# Patient Record
Sex: Male | Born: 1970 | Race: Black or African American | Hispanic: No | Marital: Single | Smoking: Current every day smoker
Health system: Southern US, Community
[De-identification: ages and names within clinical notes are randomized; demographics above are authoritative.]

## PROBLEM LIST (undated history)

## (undated) HISTORY — PX: OTHER SURGICAL HISTORY: SHX169

---

## 2019-04-02 DIAGNOSIS — J439 Emphysema, unspecified: Secondary | ICD-10-CM

## 2019-04-02 DIAGNOSIS — J939 Pneumothorax, unspecified: Secondary | ICD-10-CM

## 2019-04-02 HISTORY — DX: Emphysema, unspecified: J43.9

## 2019-04-02 HISTORY — DX: Pneumothorax, unspecified: J93.9

## 2019-04-20 ENCOUNTER — Other Ambulatory Visit: Payer: Self-pay

## 2019-04-20 ENCOUNTER — Emergency Department: Payer: Self-pay

## 2019-04-20 ENCOUNTER — Inpatient Hospital Stay
Admission: EM | Admit: 2019-04-20 | Discharge: 2019-04-29 | DRG: 163 | Disposition: A | Discharge status: Home / Self Care | Payer: Self-pay | Attending: Internal Medicine | Admitting: Internal Medicine

## 2019-04-20 ENCOUNTER — Encounter: Payer: Self-pay | Admitting: Internal Medicine

## 2019-04-20 DIAGNOSIS — R000 Tachycardia, unspecified: Secondary | ICD-10-CM | POA: Diagnosis present

## 2019-04-20 DIAGNOSIS — Z79899 Other long term (current) drug therapy: Secondary | ICD-10-CM

## 2019-04-20 DIAGNOSIS — J189 Pneumonia, unspecified organism: Secondary | ICD-10-CM | POA: Diagnosis present

## 2019-04-20 DIAGNOSIS — Z09 Encounter for follow-up examination after completed treatment for conditions other than malignant neoplasm: Secondary | ICD-10-CM

## 2019-04-20 DIAGNOSIS — J930 Spontaneous tension pneumothorax: Principal | ICD-10-CM | POA: Diagnosis present

## 2019-04-20 DIAGNOSIS — J9382 Other air leak: Secondary | ICD-10-CM | POA: Diagnosis present

## 2019-04-20 DIAGNOSIS — J939 Pneumothorax, unspecified: Secondary | ICD-10-CM

## 2019-04-20 DIAGNOSIS — J439 Emphysema, unspecified: Secondary | ICD-10-CM | POA: Diagnosis present

## 2019-04-20 DIAGNOSIS — Z23 Encounter for immunization: Secondary | ICD-10-CM

## 2019-04-20 DIAGNOSIS — Z20828 Contact with and (suspected) exposure to other viral communicable diseases: Secondary | ICD-10-CM | POA: Diagnosis present

## 2019-04-20 DIAGNOSIS — F17210 Nicotine dependence, cigarettes, uncomplicated: Secondary | ICD-10-CM | POA: Diagnosis present

## 2019-04-20 LAB — CBC WITH DIFFERENTIAL/PLATELET
Abs Immature Granulocytes: 0.13 10*3/uL — ABNORMAL HIGH (ref 0.00–0.07)
Basophils Absolute: 0.1 10*3/uL (ref 0.0–0.1)
Basophils Relative: 0 %
Eosinophils Absolute: 0 10*3/uL (ref 0.0–0.5)
Eosinophils Relative: 0 %
HCT: 42.6 % (ref 39.0–52.0)
Hemoglobin: 14.6 g/dL (ref 13.0–17.0)
Immature Granulocytes: 1 %
Lymphocytes Relative: 8 %
Lymphs Abs: 1.7 10*3/uL (ref 0.7–4.0)
MCH: 31.4 pg (ref 26.0–34.0)
MCHC: 34.3 g/dL (ref 30.0–36.0)
MCV: 91.6 fL (ref 80.0–100.0)
Monocytes Absolute: 1.6 10*3/uL — ABNORMAL HIGH (ref 0.1–1.0)
Monocytes Relative: 8 %
Neutro Abs: 18.2 10*3/uL — ABNORMAL HIGH (ref 1.7–7.7)
Neutrophils Relative %: 83 %
Platelets: 341 10*3/uL (ref 150–400)
RBC: 4.65 MIL/uL (ref 4.22–5.81)
RDW: 13.8 % (ref 11.5–15.5)
WBC: 21.7 10*3/uL — ABNORMAL HIGH (ref 4.0–10.5)
nRBC: 0 % (ref 0.0–0.2)

## 2019-04-20 LAB — COMPREHENSIVE METABOLIC PANEL
ALT: 41 U/L (ref 0–44)
AST: 55 U/L — ABNORMAL HIGH (ref 15–41)
Albumin: 4.2 g/dL (ref 3.5–5.0)
Alkaline Phosphatase: 97 U/L (ref 38–126)
Anion gap: 12 (ref 5–15)
BUN: 12 mg/dL (ref 6–20)
CO2: 23 mmol/L (ref 22–32)
Calcium: 9.7 mg/dL (ref 8.9–10.3)
Chloride: 101 mmol/L (ref 98–111)
Creatinine, Ser: 0.88 mg/dL (ref 0.61–1.24)
GFR calc Af Amer: >60 mL/min (ref >60)
GFR calc non Af Amer: >60 mL/min (ref >60)
Glucose, Bld: 113 mg/dL — ABNORMAL HIGH (ref 70–99)
Potassium: 3.8 mmol/L (ref 3.5–5.1)
Sodium: 136 mmol/L (ref 135–145)
Total Bilirubin: 1 mg/dL (ref 0.3–1.2)
Total Protein: 8.9 g/dL — ABNORMAL HIGH (ref 6.5–8.1)

## 2019-04-20 LAB — SARS CORONAVIRUS 2 (TAT 6-24 HRS): SARS Coronavirus 2: NEGATIVE

## 2019-04-20 LAB — PROTIME-INR
INR: 1.1 (ref 0.8–1.2)
Prothrombin Time: 13.8 seconds (ref 11.4–15.2)

## 2019-04-20 LAB — LACTIC ACID, PLASMA: Lactic Acid, Venous: 1.7 mmol/L (ref 0.5–1.9)

## 2019-04-20 LAB — APTT: aPTT: 34 seconds (ref 24–36)

## 2019-04-20 LAB — HIV ANTIBODY (ROUTINE TESTING W REFLEX): HIV Screen 4th Generation wRfx: NONREACTIVE

## 2019-04-20 MED ORDER — MORPHINE SULFATE (PF) 4 MG/ML IV SOLN
4.0000 mg | Freq: Once | INTRAVENOUS | Status: AC
  Administered 2019-04-20: 4 mg via INTRAVENOUS
  Filled 2019-04-20: qty 1

## 2019-04-20 MED ORDER — MORPHINE SULFATE (PF) 4 MG/ML IV SOLN
4.0000 mg | INTRAVENOUS | Status: DC | PRN
  Administered 2019-04-20: 4 mg via INTRAVENOUS
  Filled 2019-04-20: qty 1

## 2019-04-20 MED ORDER — SODIUM CHLORIDE 0.9 % IV SOLN
2.0000 g | Freq: Three times a day (TID) | INTRAVENOUS | Status: AC
  Administered 2019-04-20 – 2019-04-24 (×13): 2 g via INTRAVENOUS
  Filled 2019-04-20 (×15): qty 2

## 2019-04-20 MED ORDER — ENOXAPARIN SODIUM 40 MG/0.4ML ~~LOC~~ SOLN
40.0000 mg | SUBCUTANEOUS | Status: DC
  Administered 2019-04-20 – 2019-04-21 (×2): 40 mg via SUBCUTANEOUS
  Filled 2019-04-20 (×2): qty 0.4

## 2019-04-20 MED ORDER — ONDANSETRON HCL 4 MG/2ML IJ SOLN: 4.0000 mg | Freq: Four times a day (QID) | INTRAMUSCULAR | Status: DC | PRN

## 2019-04-20 MED ORDER — VANCOMYCIN HCL IN DEXTROSE 1-5 GM/200ML-% IV SOLN
1000.0000 mg | Freq: Two times a day (BID) | INTRAVENOUS | Status: DC
  Administered 2019-04-21: 1000 mg via INTRAVENOUS
  Filled 2019-04-20 (×3): qty 200

## 2019-04-20 MED ORDER — VANCOMYCIN HCL 1.5 G IV SOLR
1500.0000 mg | Freq: Once | INTRAVENOUS | Status: AC
  Administered 2019-04-20: 1500 mg via INTRAVENOUS
  Filled 2019-04-20: qty 1500

## 2019-04-20 MED ORDER — SODIUM CHLORIDE 0.9 % IV SOLN
INTRAVENOUS | Status: DC
  Administered 2019-04-20: 16:00:00 via INTRAVENOUS

## 2019-04-20 MED ORDER — ONDANSETRON HCL 4 MG PO TABS: 4.0000 mg | ORAL_TABLET | Freq: Four times a day (QID) | ORAL | Status: DC | PRN

## 2019-04-20 MED ORDER — NICOTINE 21 MG/24HR TD PT24
21.0000 mg | MEDICATED_PATCH | Freq: Every day | TRANSDERMAL | Status: DC
  Administered 2019-04-20 – 2019-04-29 (×10): 21 mg via TRANSDERMAL
  Filled 2019-04-20 (×10): qty 1

## 2019-04-20 MED ORDER — ACETAMINOPHEN 650 MG RE SUPP: 650.0000 mg | Freq: Four times a day (QID) | RECTAL | Status: DC | PRN

## 2019-04-20 MED ORDER — SODIUM CHLORIDE 0.9 % IV SOLN
2.0000 g | Freq: Once | INTRAVENOUS | Status: AC
  Administered 2019-04-20: 2 g via INTRAVENOUS
  Filled 2019-04-20: qty 2

## 2019-04-20 MED ORDER — LIDOCAINE HCL (PF) 1 % IJ SOLN
INTRAMUSCULAR | Status: AC
  Administered 2019-04-20: 5 mL via INTRADERMAL
  Filled 2019-04-20: qty 5

## 2019-04-20 MED ORDER — LIDOCAINE HCL (PF) 1 % IJ SOLN
5.0000 mL | Freq: Once | INTRAMUSCULAR | Status: AC
  Administered 2019-04-20: 12:00:00 5 mL via INTRADERMAL

## 2019-04-20 MED ORDER — ACETAMINOPHEN 500 MG PO TABS
ORAL_TABLET | ORAL | Status: AC
  Administered 2019-04-20: 1000 mg
  Filled 2019-04-20: qty 2

## 2019-04-20 MED ORDER — SENNOSIDES-DOCUSATE SODIUM 8.6-50 MG PO TABS: 1.0000 | ORAL_TABLET | Freq: Every evening | ORAL | Status: DC | PRN

## 2019-04-20 MED ORDER — HYDROCODONE-ACETAMINOPHEN 5-325 MG PO TABS
1.0000 | ORAL_TABLET | ORAL | Status: DC | PRN
  Administered 2019-04-20 (×2): 1 via ORAL
  Filled 2019-04-20 (×2): qty 1

## 2019-04-20 MED ORDER — VANCOMYCIN HCL IN DEXTROSE 1-5 GM/200ML-% IV SOLN: 1000.0000 mg | Freq: Once | INTRAVENOUS | Status: DC

## 2019-04-20 MED ORDER — OXYCODONE HCL 5 MG PO TABS
5.0000 mg | ORAL_TABLET | Freq: Four times a day (QID) | ORAL | Status: DC | PRN
  Administered 2019-04-20 – 2019-04-21 (×2): 5 mg via ORAL
  Filled 2019-04-20 (×3): qty 1

## 2019-04-20 MED ORDER — ACETAMINOPHEN 325 MG PO TABS
650.0000 mg | ORAL_TABLET | Freq: Four times a day (QID) | ORAL | Status: DC | PRN
  Administered 2019-04-23: 650 mg via ORAL
  Filled 2019-04-20: qty 2

## 2019-04-20 MED ORDER — METRONIDAZOLE IN NACL 5-0.79 MG/ML-% IV SOLN
500.0000 mg | Freq: Once | INTRAVENOUS | Status: AC
  Administered 2019-04-20: 500 mg via INTRAVENOUS
  Filled 2019-04-20: qty 100

## 2019-04-20 NOTE — Consult Note (Signed)
Pharmacy Antibiotic Note  Jose Andrews is a 48 y.o. male admitted on 04/20/2019 with sepsis (source unknown).  Pharmacy has been consulted for Cefepime and Vancomycin dosing.  Plan: 1. Cefepime 2 g Q8H- will start @2100  today.  2. Vancomycin 1000 mg Q12H - @0200  tomorrow. AUC Goal 400-550 Expected AUC: 509.6 Scr Used: 0.88   Height: 6\' 2"  (188 cm) Weight: 145 lb (65.8 kg) IBW/kg (Calculated) : 82.2  Temp (24hrs), Avg:102 F (38.9 C), Min:102 F (38.9 C), Max:102 F (38.9 C)  Recent Labs  Lab 04/20/19 1110 04/20/19 1258  WBC 21.7*  --   CREATININE 0.88  --   LATICACIDVEN  --  1.7    Estimated Creatinine Clearance: 94.5 mL/min (by C-G formula based on SCr of 0.88 mg/dL).    No Known Allergies  Antimicrobials this admission: 10/19 Cefepime >> 10/19 Vancomycin >>  10/19 Metronidazole x1   Microbiology results: 10/19 BCx: Pending  10/19 UCx: Pending    Thank you for allowing pharmacy to be a part of this patient's care.  Rowland Lathe 04/20/2019 1:52 PM

## 2019-04-20 NOTE — Consult Note (Signed)
PHARMACY -  BRIEF ANTIBIOTIC NOTE   Pharmacy has received consult(s) for Cefepime/Vancomycin from an ED provider.  The patient's profile has been reviewed for ht/wt/allergies/indication/available labs.    One time order(s) placed for Cefepime 2g x1 & Vancomycin 1.5 gx 1 dose.   Further antibiotics/pharmacy consults should be ordered by admitting physician if indicated.                       Thank you, Rowland Lathe 04/20/2019  12:50 PM

## 2019-04-20 NOTE — ED Triage Notes (Signed)
Pt to ED via [facility] from urgent care for chief complaint of chest pain and SHOB. Pt states chest pain began on Friday and SHOB began last week. States he was tested for COVID today and tested negative.  Pt appears now febrile and labored breathing with guarding to abdomen and chest.  EDP at bedside.

## 2019-04-20 NOTE — ED Provider Notes (Signed)
[facility] Emergency Department Provider Note  Time seen: 12:15 PM  I have reviewed the triage vital signs and the nursing notes.   HISTORY  Chief Complaint Shortness of Breath and Chest Pain   HPI Jose Andrews is a 48 y.o. male with no significant past medical history presents to the emergency department for fever shortness of breath and cough.  According to the patient over the past 4 days he has been experiencing cough which has worsened over the past 24 hours along with shortness of breath which is worsened over the past 24 hours.  Upon arrival patient has an O2 saturation in the 80s on room air, low 90s on 2 L of oxygen.  No baseline O2 requirement.  Denies any history of asthma or COPD.  Patient is found to be febrile to 102, tachycardic and tachypneic.  Patient has mild respiratory distress due to difficulty breathing and frequent coughing.   No past medical history on file.  There are no active problems to display for this patient.    Prior to Admission medications   Not on File    Not on File  No family history on file.  Social History Social History   Tobacco Use  . Smoking status: Not on file  Substance Use Topics  . Alcohol use: Not on file  . Drug use: Not on file    Review of Systems Constitutional: Positive for fever Cardiovascular: Positive for chest pain, mild.  States only has pain when coughing described as sharp. Respiratory: Positive for shortness of breath.  Positive for cough. Gastrointestinal: Negative for abdominal pain Musculoskeletal: Negative for leg pain or swelling Skin: Negative for skin complaints  Neurological: Negative for headache All other ROS negative  ____________________________________________   PHYSICAL EXAM:  VITAL SIGNS: ED Triage Vitals  Enc Vitals Group     BP 04/20/19 1108 (!) 146/106     Pulse Rate 04/20/19 1107 (!) 136     Resp 04/20/19 1107 (!) 46     Temp 04/20/19 1107 (!) 102 F  (38.9 C)     Temp Source 04/20/19 1107 Oral     SpO2 04/20/19 1106 92 %     Weight 04/20/19 1108 145 lb (65.8 kg)     Height 04/20/19 1108 6\' 2"  (1.88 m)     Head Circumference --      Peak Flow --      Pain Score 04/20/19 1108 8     Pain Loc --      Pain Edu? --      Excl. in [place]? --    Constitutional: Alert and oriented.  Mild distress due to difficulty breathing.  Frequent cough. Eyes: Normal exam ENT      Head: Normocephalic and atraumatic.      Mouth/Throat: Mucous membranes are moist. Cardiovascular: Regular rhythm rate around 120 bpm. Respiratory: Patient is quite tachypneic, appears to have clear breath sounds bilaterally.  No obvious wheeze rales or rhonchi.  Frequent cough during exam. Gastrointestinal: Soft and nontender. No distention.  Musculoskeletal: Nontender with normal range of motion in all extremities.  Neurologic:  Normal speech and language. No gross focal neurologic deficits are appreciated. Skin:  Skin is warm, dry and intact.  Psychiatric: Mood and affect are normal. Speech and behavior are normal.   ____________________________________________    EKG  EKG viewed and interpreted by myself shows sinus tachycardia 135 bpm with a narrow QRS, right axis deviation, nonspecific ST changes.  ____________________________________________  RADIOLOGY  X-ray consistent with tension pneumothorax of the right lung.  ____________________________________________   INITIAL IMPRESSION / ASSESSMENT AND PLAN / ED COURSE  Pertinent labs & imaging results that were available during my care of the patient were reviewed by me and considered in my medical decision making (see chart for details).   Patient presents to the emergency department for shortness of breath and cough.  Portable chest x-ray consistent with tension pneumothorax.  Vital signs show hypoxia tachypnea and tachycardia.  Patient is febrile to 102.  I placed a chest tube into the patient's right  hemithorax, with immediate coughing air rush heard.  Repeat chest x-ray confirms lung reexpansion.  However the patient was febrile to 102 tachycardic and tachypneic.  Differential would still include pneumonia, Covid.  Difficult to say whether the patient had an infection leading to cough and pneumothorax versus pneumothorax leading to infection and fever.  If Covid is negative we will cover with broad-spectrum antibiotics.  Regardless patient will require admission to the hospital.  Hospital unable to run a rapid Covid due to significant shortage.  We will cover with broad-spectrum antibiotics given the patient meets sepsis criteria, Covid pending.  Patient will be admitted to the medical service with surgery consulting for chest tube management.  Jose Andrews was evaluated in Emergency Department on 04/20/2019 for the symptoms described in the history of present illness. He was evaluated in the context of the global COVID-19 pandemic, which necessitated consideration that the patient might be at risk for infection with the SARS-CoV-2 virus that causes COVID-19. Institutional protocols and algorithms that pertain to the evaluation of patients at risk for COVID-19 are in a state of rapid change based on information released by regulatory bodies including the CDC and federal and state organizations. These policies and algorithms were followed during the patient's care in the ED.  CRITICAL CARE Performed by: Minna AntisKevin Ashish Rossetti   Total critical care time: 30 minutes  Critical care time was exclusive of separately billable procedures and treating other patients.  Critical care was necessary to treat or prevent imminent or life-threatening deterioration.  Critical care was time spent personally by me on the following activities: development of treatment plan with patient and/or surrogate as well as nursing, discussions with consultants, evaluation of patient's response to treatment, examination of  patient, obtaining history from patient or surrogate, ordering and performing treatments and interventions, ordering and review of laboratory studies, ordering and review of radiographic studies, pulse oximetry and re-evaluation of patient's condition.  CHEST TUBE INSERTION Date/Time: 04/20/2019 at 12:51 PM Performed by: Minna AntisKevin Elli Groesbeck Consent: The procedure was performed in an emergent situation. Imaging studies: imaging studies available Required items: required blood products, implants, devices, and special equipment available Patient identity confirmed: arm band and available demographic data Time out: Immediately prior to procedure a "time out" was called to verify the correct patient, procedure, equipment, support staff and site/side marked as required.  Indications: tension pneumothorax",pneumothorax  Patient sedated: no Anesthesia: yes local anesthetic. Preparation: skin prepped with ChloraPrep  Placement location: Right anterior lateral   Scalpel size: 11 Tube size: 8 French  Tension pneumothorax heard: yes Tube connected to: water seal Drainage characteristics: Minimal blood Drainage amount: 1-2 ml  Suture material: -0 silk Dressing: Vaseline gauze, 4 x 4's, tape  Post-insertion x-ray findings: Reinflation of the lung. Patient tolerance: Patient tolerated the procedure well with no immediate complications       ____________________________________________   FINAL CLINICAL IMPRESSION(S) / ED DIAGNOSES  Tension  pneumothorax   Minna Antis, MD 04/20/19 1253

## 2019-04-20 NOTE — ED Notes (Signed)
Surgical team at bedside. Told pt he will not be having surgical intervention today. Surgical team reportedly told pt he could have food and drink.

## 2019-04-20 NOTE — Consult Note (Signed)
[facility] SUR[facility] CON[facility]ULTATION NOTE (initial) - cpt: 95284: 99243 (Outpatient/ED)   HISTORY OF PRESENT ILLNESS (HPI):  48 y.o. male [facility] today for evaluation of SOB. Patient reports that over the last 4 days he has had worsening SOB and cough. This became severe in the last 24 hours. He also reports a subjective fever with these symptoms and he was febrile to 102F on arrival. Nothing seems to make these symptoms better. SOB became constant. He denied any chest pain, nausea, or emesis with these symptoms. No history of trauma, COPD, or asthma. He is an active smoker. While in the ED, he was found to be hypoxic and CXR showed showed right tension pneumothorax. He was also found to have significant leukocytosis to 21K.   Surgery is consulted by emergency medicine physician Dr. Lenard LancePaduchowski, MD in this context for evaluation and management of right tension pneumothorax.   PAST MEDICAL HISTORY (PMH):  No past medical history on file.   PAST SURGICAL HISTORY (PSH):  No previous surgeries   MEDICATIONS:  Prior to Admission medications   Medication Sig Start Date End Date Taking? Authorizing Provider  acetaminophen (TYLENOL) 500 MG tablet Take 500-1,000 mg by mouth every 6 (six) hours as needed for mild pain, moderate pain or fever.   Yes [provider]     ALLERGIES:  No Known Allergies   SOCIAL HISTORY:  Social History   Socioeconomic History  . Marital status: Single    Spouse name: Not on file  . Number of children: Not on file  . Years of education: Not on file  . Highest education level: Not on file  Occupational History  . Not on file  Social Needs  . Financial resource strain: Not on file  . Food insecurity    Worry: Not on file    Inability: Not on file  . Transportation needs    Medical: Not on file    Non-medical: Not on file  Tobacco Use  . Smoking status: Current Every Day Smoker  . Smokeless tobacco: Never Used  Substance and  Sexual Activity  . Alcohol use: Never    Frequency: Never  . Drug use: Never  . Sexual activity: Yes  Lifestyle  . Physical activity    Days per week: Not on file    Minutes per session: Not on file  . Stress: Not on file  Relationships  . Social Musicianconnections    Talks on phone: Not on file    Gets together: Not on file    Attends religious service: Not on file    Active member of club or organization: Not on file    Attends meetings of clubs or organizations: Not on file    Relationship status: Not on file  . Intimate partner violence    Fear of current or ex partner: Not on file    Emotionally abused: Not on file    Physically abused: Not on file    Forced sexual activity: Not on file  Other Topics Concern  . Not on file  Social History Narrative  . Not on file     FAMILY HISTORY:  No family history on file.    REVIEW OF SYSTEMS:  Review of Systems  Constitutional: Positive for fever. Negative for chills.  HENT: Negative for congestion and sore throat.   Respiratory: Positive for cough and shortness of breath. Negative for hemoptysis, sputum production and wheezing.   Cardiovascular: Negative for chest pain and palpitations.  Gastrointestinal: Negative for abdominal pain, nausea and vomiting.  Neurological: Negative for dizziness and headaches.  All other systems reviewed and are negative.   VITAL SIGNS:  Temp:  [102 F (38.9 C)] 102 F (38.9 C) (10/19 1107) Pulse Rate:  [117-136] 117 (10/19 1300) Resp:  [30-46] 35 (10/19 1300) BP: (137-150)/(94-106) 137/94 (10/19 1300) SpO2:  [91 %-100 %] 100 % (10/19 1300) Weight:  [65.8 kg] 65.8 kg (10/19 1108)     Height: 6\' 2"  (188 cm) Weight: 65.8 kg BMI (Calculated): 18.61   INTAKE/OUTPUT:  No intake/output data recorded.  PHYSICAL EXAM:  Physical Exam Vitals signs and nursing note reviewed.  Constitutional:      General: He is not in acute distress.    Appearance: He is well-developed and normal weight.  HENT:      Head: Normocephalic and atraumatic.     Mouth/Throat:     Mouth: Mucous membranes are moist.     Pharynx: Oropharynx is clear.  Eyes:     Extraocular Movements: Extraocular movements intact.     Pupils: Pupils are equal, round, and reactive to light.  Cardiovascular:     Rate and Rhythm: Regular rhythm. Tachycardia present.     Heart sounds: No murmur. No friction rub. No gallop.   Pulmonary:     Effort: Pulmonary effort is normal. No tachypnea, accessory muscle usage or respiratory distress.     Breath sounds: No decreased breath sounds, wheezing or rhonchi.  Chest:     Chest wall: No deformity or crepitus.     Comments: Chest tube to right lateral chest wall, air leak Abdominal:     Palpations: Abdomen is soft. There is no mass.     Tenderness: There is no abdominal tenderness.  Musculoskeletal: Normal range of motion.     Right lower leg: No edema.     Left lower leg: No edema.  Skin:    General: Skin is warm and dry.     Coloration: Skin is not pale.     Findings: No erythema.  Neurological:     General: No focal deficit present.     Mental Status: He is alert and oriented to person, place, and time.  Psychiatric:        Mood and Affect: Mood normal.        Behavior: Behavior normal.      Labs:  CBC Latest Ref Rng & Units 04/20/2019  WBC 4.0 - 10.5 K/uL 21.7(H)  Hemoglobin 13.0 - 17.0 g/dL 04/22/2019  Hematocrit 17.5 - 52.0 % 42.6  Platelets 150 - 400 K/uL 341   CMP Latest Ref Rng & Units 04/20/2019  Glucose 70 - 99 mg/dL 04/22/2019)  BUN 6 - 20 mg/dL 12  Creatinine 585(I - 7.78 mg/dL 2.42  Sodium 3.53 - 614 mmol/L 136  Potassium 3.5 - 5.1 mmol/L 3.8  Chloride 98 - 111 mmol/L 101  CO2 22 - 32 mmol/L 23  Calcium 8.9 - 10.3 mg/dL 9.7  Total Protein 6.5 - 8.1 g/dL 8.9(H)  Total Bilirubin 0.3 - 1.2 mg/dL 1.0  Alkaline Phos 38 - 126 U/L 97  AST 15 - 41 U/L 55(H)  ALT 0 - 44 U/L 41     Imaging studies:   CXR (04/20/2019 - @1120 ) personally reviewed which showed  significant right pneumothorax with tension component, and radiologist report reviewed:  IMPRESSION: Changes consistent with tension pneumothorax on the right with mediastinal shift to the left.   CXR (04/20/2019 - @1210 ) personally reviewed which showed placement  of right chest tube with re-inflation of right lung, and radiologist report reviewed:  IMPRESSION: Interval re-expansion of the right lung with small bore chest tube in place. No visible pneumothorax.  Improving patchy left lung airspace disease.   Assessment/Plan: (ICD-10's: J93.0) 48 y.o. male with SOB attributable to right pneumothorax with tension component s/p placement of chest tube in ED, complicated by fever without obvious source   - Admit to medicine service for fever/infection work up   - Appreciate ED help with placement of chest tube; -20 cm suction; monitor output/air leak   - Morning CXR   - Pulmonary toilet; IS use  - Further management per medicine service  All of the above findings and recommendations were discussed with the patient, and all of patient's questions were answered to his expressed satisfaction.  Thank you for the opportunity to participate in this patient's care.   -- Edison Simon, PA-C [facility] 04/20/2019, 2:13 PM [phone] M-F: 7am - 4pm

## 2019-04-20 NOTE — H&P (Signed)
[facility] - [place] at [facility]   PATIENT NAME: Jose Andrews    MR#:  [id number]  DATE OF BIRTH:  1970/10/13  DATE OF ADMISSION:  04/20/2019  PRIMARY CARE PHYSICIAN: Patient, No Pcp Per   REQUESTING/REFERRING PHYSICIAN:   CHIEF COMPLAINT:   Chief Complaint  Patient presents with  . Shortness of Breath  . Chest Pain    HISTORY OF PRESENT ILLNESS: Jose Andrews  is a 48 y.o. male with no significant past medical history presented to emergency room for cough for the last couple of days.  Patient also had fever.  Patient had excessive cough and had chest discomfort he came to the emergency room with short of breath he was evaluated chest x-ray showed pneumothorax.  Patient has been longtime smoker and chest tube was placed by ER attending.  Surgery was consulted.  Patient has elevated WBC count and started on IV antibiotics.  No recent travel, sick contacts.  Has low-grade fever, chills and myalgias.  COVID-19 test pending.  PAST MEDICAL HISTORY: No history of COPD diabetes and heart disease  PAST SURGICAL HISTORY: None  SOCIAL HISTORY:  Social History   Tobacco Use  . Smoking status: Current Every Day Smoker  . Smokeless tobacco: Never Used  Substance Use Topics  . Alcohol use: Never    Frequency: Never    FAMILY HISTORY: No history of COPD diabetes cancer or lung disease in the family  DRUG ALLERGIES: No Known Allergies  REVIEW OF SYSTEMS:   CONSTITUTIONAL: Has fever, fatigue and weakness.  EYES: No blurred or double vision.  EARS, NOSE, AND THROAT: No tinnitus or ear pain.  RESPIRATORY: Has cough, shortness of breath,  No wheezing or hemoptysis.  CARDIOVASCULAR: No chest pain, orthopnea, edema.  GASTROINTESTINAL: No nausea, vomiting, diarrhea or abdominal pain.  GENITOURINARY: No dysuria, hematuria.  ENDOCRINE: No polyuria, nocturia,  HEMATOLOGY: No anemia, easy bruising or bleeding SKIN: No rash or lesion. MUSCULOSKELETAL: No joint  pain or arthritis.   NEUROLOGIC: No tingling, numbness, weakness.  PSYCHIATRY: No anxiety or depression.   MEDICATIONS AT HOME:  Prior to Admission medications   Medication Sig Start Date End Date Taking? Authorizing Provider  acetaminophen (TYLENOL) 500 MG tablet Take 500-1,000 mg by mouth every 6 (six) hours as needed for mild pain, moderate pain or fever.   Yes [provider]      PHYSICAL EXAMINATION:   VITAL SIGNS: Blood pressure (!) 137/94, pulse (!) 117, temperature (!) 102 F (38.9 C), temperature source Oral, resp. rate (!) 35, height 6\' 2"  (1.88 m), weight 65.8 kg, SpO2 100 %.  GENERAL:  48 y.o.-year-old patient lying in the bed with no acute distress.  EYES: Pupils equal, round, reactive to light and accommodation. No scleral icterus. Extraocular muscles intact.  HEENT: Head atraumatic, normocephalic. Oropharynx and nasopharynx clear.  NECK:  Supple, no jugular venous distention. No thyroid enlargement, no tenderness.  LUNGS: Decreased breath sounds bilaterally, improved airflow right lung. No use of accessory muscles of respiration.  Chest tube noted on the right side CARDIOVASCULAR: S1, S2 normal. No murmurs, rubs, or gallops.  ABDOMEN: Soft, nontender, nondistended. Bowel sounds present. No organomegaly or mass.  EXTREMITIES: No pedal edema, cyanosis, or clubbing.  NEUROLOGIC: Cranial nerves II through XII are intact. Muscle strength 5/5 in all extremities. Sensation intact. Gait not checked.  PSYCHIATRIC: The patient is alert and oriented x 3.  SKIN: No obvious rash, lesion, or ulcer.   LABORATORY PANEL:   CBC Recent Labs  Lab 04/20/19 1110  WBC 21.7*  HGB 14.6  HCT 42.6  PLT 341  MCV 91.6  MCH 31.4  MCHC 34.3  RDW 13.8  LYMPHSABS 1.7  MONOABS 1.6*  EOSABS 0.0  BASOSABS 0.1   ------------------------------------------------------------------------------------------------------------------  Chemistries  Recent Labs  Lab 04/20/19 1110  NA  136  K 3.8  CL 101  CO2 23  GLUCOSE 113*  BUN 12  CREATININE 0.88  CALCIUM 9.7  AST 55*  ALT 41  ALKPHOS 97  BILITOT 1.0   ------------------------------------------------------------------------------------------------------------------ estimated creatinine clearance is 94.5 mL/min (by C-G formula based on SCr of 0.88 mg/dL). ------------------------------------------------------------------------------------------------------------------ No results for input(s): TSH, T4TOTAL, T3FREE, THYROIDAB in the last 72 hours.  Invalid input(s): FREET3   Coagulation profile Recent Labs  Lab 04/20/19 1110  INR 1.1   ------------------------------------------------------------------------------------------------------------------- No results for input(s): DDIMER in the last 72 hours. -------------------------------------------------------------------------------------------------------------------  Cardiac Enzymes No results for input(s): CKMB, TROPONINI, MYOGLOBIN in the last 168 hours.  Invalid input(s): CK ------------------------------------------------------------------------------------------------------------------ Invalid input(s): POCBNP  ---------------------------------------------------------------------------------------------------------------  Urinalysis No results found for: COLORURINE, APPEARANCEUR, LABSPEC, PHURINE, GLUCOSEU, HGBUR, BILIRUBINUR, KETONESUR, PROTEINUR, UROBILINOGEN, NITRITE, LEUKOCYTESUR   RADIOLOGY: Dg Chest Portable 1 View  Result Date: 04/20/2019 CLINICAL DATA:  Pneumothorax, chest tube EXAM: PORTABLE CHEST 1 VIEW COMPARISON:  04/20/2019 FINDINGS: Small bore right chest tube has been placed with re-expansion of the right lung. No visible pneumothorax currently. Heart is normal size. Patchy airspace disease in the left lung, improved since prior study. No effusions. IMPRESSION: Interval re-expansion of the right lung with small bore chest tube in  place. No visible pneumothorax. Improving patchy left lung airspace disease. Electronically Signed   By: Charlett NoseKevin  Dover M.D.   On: 04/20/2019 12:13   Dg Chest Portable 1 View  Result Date: 04/20/2019 CLINICAL DATA:  Chest pain and shortness of breath EXAM: PORTABLE CHEST 1 VIEW COMPARISON:  None. FINDINGS: Cardiac shadows within normal limits but mildly shift to the left as is the tracheal shadow. Left lung is well aerated without focal infiltrate. There is complete collapse of the right lung consistent with pneumothorax. Mild mediastinal shift to the left is noted consistent with a tension component. No bony abnormality seen. IMPRESSION: Changes consistent with tension pneumothorax on the right with mediastinal shift to the left. Critical Value/emergent results were called by telephone at the time of interpretation on 04/20/2019 at 11:28 am to Dr. Minna AntisKEVIN PADUCHOWSKI , who verbally acknowledged these results. Electronically Signed   By: Alcide CleverMark  Lukens M.D.   On: 04/20/2019 11:28    EKG: Orders placed or performed during the hospital encounter of 04/20/19  . EKG 12-Lead  . EKG 12-Lead  . ED EKG 12-Lead  . ED EKG 12-Lead    IMPRESSION AND PLAN: 48 year old male patient with no significant past medical history presented to emergency room for cough for the last couple of days.  Patient also had fever.  Patient had excessive cough and had chest discomfort he came to the emergency room with short of breath he was evaluated chest x-ray showed pneumothorax.  -Tension pneumothorax Status post chest tube Surgery consult for chest tube management Serial chest x-rays  -Systemic inflammatory response syndrome Start broad-spectrum IV antibiotics Follow-up cultures Follow-up WBC count  -Tobacco abuse Tobacco cessation counseled the patient for 6 minutes Nicotine patch offered  -DVT prophylaxis subcu Lovenox daily All the records are reviewed and case discussed with ED provider. Management plans  discussed with the patient, family and they are in agreement.  CODE STATUS: Full code  TOTAL TIME TAKING CARE OF THIS PATIENT: 53 minutes.    Saundra Shelling M.D on 04/20/2019 at 1:19 PM  Between 7am to 6pm - Pager - [phone]  After 6pm go to [email or web address] - password [email or web address] [facility]  Office  [phone]  CC: Primary care physician; Patient, No Pcp Per

## 2019-04-20 NOTE — Progress Notes (Signed)
CODE SEPSIS - PHARMACY COMMUNICATION  **Broad Spectrum Antibiotics should be administered within 1 hour of Sepsis diagnosis**  Time Code Sepsis Called/Page Received: @1243   Antibiotics Ordered: Cefepime/Vancomycin    Time of 1st antibiotic administration: Cefepime @1313   Additional action taken by pharmacy: N/A  If necessary, Name of Provider/Nurse Contacted: N/A    Rowland Lathe ,PharmD Clinical Pharmacist  04/20/2019  12:51 PM

## 2019-04-20 NOTE — Progress Notes (Signed)
Advanced care plan.  Purpose of the Encounter: CODE STATUS  Parties in Attendance: Patient  Patient's Decision Capacity: Good  Subjective/Patient's story: 48 y.o. male with no significant past medical history presented to emergency room for cough for the last couple of days.  Patient also had fever.  Patient had excessive cough and had chest discomfort he came to the emergency room with short of breath he was evaluated chest x-ray showed pneumothorax.  Patient has been longtime smoker and chest tube was placed by ER attending.  Surgery was consulted.  Patient has elevated WBC count and started on IV antibiotics.  No recent travel, sick contacts.  Has low-grade fever, chills and myalgias.  COVID-19 test pending   Objective/Medical story Patient needs chest tube for pneumothorax Needs surgical service to evaluate and follow-up on chest tube COVID-19 testing Treatment with antibiotics for sepsis   Goals of care determination:  Advance care directives goals of care treatment plan discussed Patient wants everything done which include CPR, intubation ventilator if the need arises   CODE STATUS: Full code   Time spent discussing advanced care planning: 16 minutes

## 2019-04-21 ENCOUNTER — Inpatient Hospital Stay: Payer: Self-pay

## 2019-04-21 DIAGNOSIS — J939 Pneumothorax, unspecified: Secondary | ICD-10-CM

## 2019-04-21 LAB — URINALYSIS, ROUTINE W REFLEX MICROSCOPIC
Bilirubin Urine: NEGATIVE
Glucose, UA: NEGATIVE mg/dL
Hgb urine dipstick: NEGATIVE
Ketones, ur: NEGATIVE mg/dL
Leukocytes,Ua: NEGATIVE
Nitrite: NEGATIVE
Protein, ur: 30 mg/dL — AB
Specific Gravity, Urine: 1.024 (ref 1.005–1.030)
pH: 6 (ref 5.0–8.0)

## 2019-04-21 LAB — CBC
HCT: 39.5 % (ref 39.0–52.0)
Hemoglobin: 13.5 g/dL (ref 13.0–17.0)
MCH: 31.3 pg (ref 26.0–34.0)
MCHC: 34.2 g/dL (ref 30.0–36.0)
MCV: 91.6 fL (ref 80.0–100.0)
Platelets: 314 10*3/uL (ref 150–400)
RBC: 4.31 MIL/uL (ref 4.22–5.81)
RDW: 13.9 % (ref 11.5–15.5)
WBC: 14.7 10*3/uL — ABNORMAL HIGH (ref 4.0–10.5)
nRBC: 0 % (ref 0.0–0.2)

## 2019-04-21 LAB — BASIC METABOLIC PANEL
Anion gap: 6 (ref 5–15)
BUN: 14 mg/dL (ref 6–20)
CO2: 25 mmol/L (ref 22–32)
Calcium: 8.6 mg/dL — ABNORMAL LOW (ref 8.9–10.3)
Chloride: 103 mmol/L (ref 98–111)
Creatinine, Ser: 0.97 mg/dL (ref 0.61–1.24)
GFR calc Af Amer: >60 mL/min (ref >60)
GFR calc non Af Amer: >60 mL/min (ref >60)
Glucose, Bld: 102 mg/dL — ABNORMAL HIGH (ref 70–99)
Potassium: 4.7 mmol/L (ref 3.5–5.1)
Sodium: 134 mmol/L — ABNORMAL LOW (ref 135–145)

## 2019-04-21 LAB — MRSA PCR SCREENING: MRSA by PCR: NEGATIVE

## 2019-04-21 MED ORDER — OXYCODONE HCL 5 MG PO TABS
10.0000 mg | ORAL_TABLET | ORAL | Status: DC | PRN
  Administered 2019-04-21 – 2019-04-24 (×10): 10 mg via ORAL
  Filled 2019-04-21 (×9): qty 2

## 2019-04-21 MED ORDER — MORPHINE SULFATE (PF) 4 MG/ML IV SOLN
3.0000 mg | Freq: Once | INTRAVENOUS | Status: AC
  Administered 2019-04-21: 3 mg via INTRAVENOUS
  Filled 2019-04-21: qty 1

## 2019-04-21 MED ORDER — OXYCODONE HCL 5 MG PO TABS
5.0000 mg | ORAL_TABLET | ORAL | Status: DC | PRN
  Administered 2019-04-21: 5 mg via ORAL

## 2019-04-21 MED ORDER — BENZONATATE 100 MG PO CAPS
100.0000 mg | ORAL_CAPSULE | Freq: Three times a day (TID) | ORAL | Status: DC
  Administered 2019-04-21 – 2019-04-29 (×23): 100 mg via ORAL
  Filled 2019-04-21 (×23): qty 1

## 2019-04-21 MED ORDER — MORPHINE SULFATE (PF) 2 MG/ML IV SOLN: 2.0000 mg | INTRAVENOUS | Status: DC | PRN

## 2019-04-21 MED ORDER — MORPHINE SULFATE (PF) 2 MG/ML IV SOLN
2.0000 mg | INTRAVENOUS | Status: DC | PRN
  Administered 2019-04-21 – 2019-04-22 (×3): 2 mg via INTRAVENOUS
  Filled 2019-04-21 (×4): qty 1

## 2019-04-21 MED ORDER — SODIUM CHLORIDE 0.9% FLUSH
3.0000 mL | Freq: Two times a day (BID) | INTRAVENOUS | Status: DC
  Administered 2019-04-21 – 2019-04-28 (×16): 3 mL via INTRAVENOUS

## 2019-04-21 MED ORDER — VANCOMYCIN HCL 10 G IV SOLR
1750.0000 mg | INTRAVENOUS | Status: DC
  Administered 2019-04-21: 1750 mg via INTRAVENOUS
  Filled 2019-04-21 (×2): qty 1750

## 2019-04-21 NOTE — Progress Notes (Signed)
  Patient ID: Jose Andrews, male   DOB: Oct 03, 1970, 48 y.o.   MRN: [id number]  HISTORY: Overall he had a pretty quiet night.  He states he feels a lot better since he has been admitted to the hospital.  His temperature curve is been trending down as has his heart rate.  He did go downstairs today for chest x-ray.  That chest x-ray was independently reviewed by me and shows a progression of his right pneumothorax.   Vitals:   04/21/19 0604 04/21/19 0747  BP: 127/83 131/88  Pulse: 67 65  Resp: 20 20  Temp: 98.1 F (36.7 C) 97.8 F (36.6 C)  SpO2: 100% 100%     EXAM:  When I examined the patient today he was off suction.  I placed the chest tube back to suction and there is a large amount of air that came from the pleural space.  I then placed the tube back on suction at 20 cm of water and the air leak reverted back to its prior level which was a small air leak.  Resp: Lungs are clear bilaterally.  No respiratory distress, normal effort. Heart:  Regular without murmurs Abd:  Abdomen is soft, non distended and non tender. No masses are palpable.  There is no rebound and no guarding.  Neurological: Alert and oriented to person, place, and time. Coordination normal.  Skin: Skin is warm and dry. No rash noted. No diaphoretic. No erythema. No pallor.  Psychiatric: Normal mood and affect. Normal behavior. Judgment and thought content normal.    ASSESSMENT: Right spontaneous pneumothorax.  I placed this chest tube back to 20 cm water suction.  I repeated a stat portable chest x-ray and have independently reviewed that.  There is only a trace apical pneumothorax present and it is much improved to the 1 several hours ago.   PLAN:   We plan to leave the chest tube on suction for now.  He should have only portable x-rays for the time being.  At some point will need to consider obtaining a CT scan of the chest.    Nestor Lewandowsky, MDPatient ID: Jose Andrews, male   DOB: 1970-07-15, 48 y.o.    MRN: [id number]

## 2019-04-21 NOTE — Progress Notes (Signed)
[facility] [facility] at [place] NAME: Jose Andrews    MR#:  [id number]  DATE OF BIRTH:  [birth year]  SUBJECTIVE:   Came in with cough and chest pain..found to have tension PTX s/p CT placement Having lot of Chest pain on the right with cough REVIEW OF SYSTEMS:   Review of Systems  Constitutional: Negative for chills, fever and weight loss.  HENT: Negative for ear discharge, ear pain and nosebleeds.   Eyes: Negative for blurred vision, pain and discharge.  Respiratory: Positive for cough. Negative for sputum production, shortness of breath, wheezing and stridor.   Cardiovascular: Positive for chest pain. Negative for palpitations, orthopnea and PND.  Gastrointestinal: Negative for abdominal pain, diarrhea, nausea and vomiting.  Genitourinary: Negative for frequency and urgency.  Musculoskeletal: Negative for back pain and joint pain.  Neurological: Negative for sensory change, speech change, focal weakness and weakness.  Psychiatric/Behavioral: Negative for depression and hallucinations. The patient is not nervous/anxious.    Tolerating Diet:yes Tolerating PT: not needed  DRUG ALLERGIES:  No Known Allergies  VITALS:  Blood pressure 131/88, pulse 65, temperature 97.8 F (36.6 C), temperature source Oral, resp. rate 20, height 6\' 2"  (1.88 m), weight 63.4 kg, SpO2 100 %.  PHYSICAL EXAMINATION:   Physical Exam  GENERAL:  48 y.o.-year-old patient lying in the bed with no acute distress.  EYES: Pupils equal, round, reactive to light and accommodation. No scleral icterus. Extraocular muscles intact.  HEENT: Head atraumatic, normocephalic. Oropharynx and nasopharynx clear.  NECK:  Supple, no jugular venous distention. No thyroid enlargement, no tenderness.  LUNGS: Normal breath sounds bilaterally, no wheezing, rales, rhonchi. No use of accessory muscles of respiration.   Right side CT placed+ CARDIOVASCULAR: S1, S2 normal. No murmurs, rubs,  or gallops.  ABDOMEN: Soft, nontender, nondistended. Bowel sounds present. No organomegaly or mass.  EXTREMITIES: No cyanosis, clubbing or edema b/l.    NEUROLOGIC: Cranial nerves II through XII are intact. No focal Motor or sensory deficits b/l.   PSYCHIATRIC:  patient is alert and oriented x 3.  SKIN: No obvious rash, lesion, or ulcer.   LABORATORY PANEL:  CBC Recent Labs  Lab 04/21/19 0621  WBC 14.7*  HGB 13.5  HCT 39.5  PLT 314    Chemistries  Recent Labs  Lab 04/20/19 1110 04/21/19 0621  NA 136 134*  K 3.8 4.7  CL 101 103  CO2 23 25  GLUCOSE 113* 102*  BUN 12 14  CREATININE 0.88 0.97  CALCIUM 9.7 8.6*  AST 55*  --   ALT 41  --   ALKPHOS 97  --   BILITOT 1.0  --    Cardiac Enzymes No results for input(s): TROPONINI in the last 168 hours. RADIOLOGY:  Dg Chest 2 View  Result Date: 04/21/2019 CLINICAL DATA:  Sepsis, right-sided pneumothorax, chest tube EXAM: CHEST - 2 VIEW COMPARISON:  04/20/2019 FINDINGS: Substantial interval increase in size of a right-sided pneumothorax, now moderate, approximately 40% volume. Right-sided small bore chest tube remains in position with tip about the apex. Interval increase in dense interstitial opacity of the left lung. The heart and mediastinum are normal. IMPRESSION: 1. Substantial interval increase in size of a right-sided pneumothorax, now moderate, approximately 40% volume. Right-sided small bore chest tube remains in position with tip about the apex. 2. Interval increase in dense interstitial opacity of the left lung, consistent with worsened infection or edema. These results will be called to the ordering clinician or representative by  the Radiologist Assistant, and communication documented in the PACS or zVision Dashboard. Electronically Signed   By: Lauralyn PrimesAlex  Bibbey M.D.   On: 04/21/2019 09:06   Dg Chest Port 1 View  Result Date: 04/21/2019 CLINICAL DATA:  Follow-up right pneumothorax EXAM: PORTABLE CHEST 1 VIEW COMPARISON:   Film from earlier in the same day. FINDINGS: Cardiac shadow remains enlarged. The known right-sided pneumothorax is again nearly completely resolved with only a minimal apical component present. Increased interstitial changes are noted primarily within the left lung and the right lung base which may represent some re-expansion edema. No bony abnormality is seen. IMPRESSION: Near complete resolution of right-sided pneumothorax. Mild increased interstitial markings bilaterally worse on the left which may be related to some re-expansion edema. Electronically Signed   By: Alcide CleverMark  Lukens M.D.   On: 04/21/2019 11:23   Dg Chest Portable 1 View  Result Date: 04/20/2019 CLINICAL DATA:  Pneumothorax, chest tube EXAM: PORTABLE CHEST 1 VIEW COMPARISON:  04/20/2019 FINDINGS: Small bore right chest tube has been placed with re-expansion of the right lung. No visible pneumothorax currently. Heart is normal size. Patchy airspace disease in the left lung, improved since prior study. No effusions. IMPRESSION: Interval re-expansion of the right lung with small bore chest tube in place. No visible pneumothorax. Improving patchy left lung airspace disease. Electronically Signed   By: Charlett NoseKevin  Dover M.D.   On: 04/20/2019 12:13   Dg Chest Portable 1 View  Result Date: 04/20/2019 CLINICAL DATA:  Chest pain and shortness of breath EXAM: PORTABLE CHEST 1 VIEW COMPARISON:  None. FINDINGS: Cardiac shadows within normal limits but mildly shift to the left as is the tracheal shadow. Left lung is well aerated without focal infiltrate. There is complete collapse of the right lung consistent with pneumothorax. Mild mediastinal shift to the left is noted consistent with a tension component. No bony abnormality seen. IMPRESSION: Changes consistent with tension pneumothorax on the right with mediastinal shift to the left. Critical Value/emergent results were called by telephone at the time of interpretation on 04/20/2019 at 11:28 am to Dr. Minna AntisKEVIN  PADUCHOWSKI , who verbally acknowledged these results. Electronically Signed   By: Alcide CleverMark  Lukens M.D.   On: 04/20/2019 11:28   ASSESSMENT AND PLAN:  Jose Andrews  is a 48 y.o. male with no significant past medical history presented to emergency room for cough for the last couple of days.  Patient also had fever.  Patient had excessive cough and had chest discomfort he came to the emergency room with short of breath he was evaluated chest x-ray showed pneumothorax  * Right sided Tension pneumothorax -Status post chest tube -Surgery consult for chest tube management with Dr Thelma Bargeoaks -CXR from this am shows 40% volume loss--surgery adjusted suction  *Systemic inflammatory response syndrome -Start broad-spectrum IV antibiotics with cefepime -MRSA pCR pending--if negative d/c vanc -Follow-up cultures  * Emphysema on CXR -pt strongly advised on smoking cessation -prn inhalers  *Tobacco abuse Tobacco cessation counseled the patient for 6 minutes Nicotine patch offered  *DVT prophylaxis subcu Lovenox daily  Case discussed with Care Management/Social Worker. Management plans discussed with the patient and they are in agreement.  CODE STATUS: full  DVT Prophylaxis: lovenox  TOTAL TIME TAKING CARE OF THIS PATIENT: 30** minutes.  >50% time spent on counselling and coordination of care  POSSIBLE D/C IN *?* DAYS, DEPENDING ON CLINICAL CONDITION.  Note: This dictation was prepared with Dragon dictation along with smaller phrase technology. Any transcriptional errors that result from this  process are unintentional.  Enedina Finner M.D on 04/21/2019 at 1:23 PM  Between 7am to 6pm - Pager - [phone]  After 6pm go to [email or web address] - password [email or web address]  [facility]  Office  [phone]  CC: Primary care physician; Patient, No Pcp PerPatient ID: Jose Andrews, male   DOB: 01-Nov-1970, 48 y.o.   MRN: [id number]

## 2019-04-21 NOTE — Consult Note (Addendum)
Pharmacy Antibiotic Note  Jose Andrews is a 49 y.o. male admitted on 04/20/2019 with sepsis (source unknown).  Pharmacy has been consulted for Cefepime and Vancomycin dosing.  Plan: 1. Continue Cefepime 2 g Q8H  2. Update Vancomycin to 1750 mg Q24H AUC Goal 400-550 Expected AUC: 524.4 Scr Used: 0.97 (slight bump in Scr from 0.88 - adjusting dose)   Height: 6\' 2"  (188 cm) Weight: 139 lb 12.4 oz (63.4 kg) IBW/kg (Calculated) : 82.2  Temp (24hrs), Avg:99.4 F (37.4 C), Min:97.8 F (36.6 C), Max:102 F (38.9 C)  Recent Labs  Lab 04/20/19 1110 04/20/19 1258 04/21/19 0621  WBC 21.7*  --  14.7*  CREATININE 0.88  --  0.97  LATICACIDVEN  --  1.7  --     Estimated Creatinine Clearance: 82.6 mL/min (by C-G formula based on SCr of 0.97 mg/dL).    No Known Allergies  Antimicrobials this admission: 10/19 Cefepime >> 10/19 Vancomycin >>  (Dose adjusted from 1000mg  q12 to 1750mg  q24) 10/19 Metronidazole x1   Microbiology results: 10/19 BCx: Pending  10/19 UCx: Pending  COVID NEG  Thank you for allowing pharmacy to be a part of this patient's care.  Lu Duffel, PharmD, BCPS Clinical Pharmacist 04/21/2019 8:03 AM

## 2019-04-21 NOTE — Progress Notes (Signed)
Initial Nutrition Assessment  DOCUMENTATION CODES:   Underweight  INTERVENTION:   Ensure Enlive po TID, each supplement provides 350 kcal and 20 grams of protein  MVI daily   NUTRITION DIAGNOSIS:   Predicted suboptimal nutrient intake related to social / environmental circumstances as evidenced by other (comment)(pt is underweight).  GOAL:   Patient will meet greater than or equal to 90% of their needs  MONITOR:   PO intake, Supplement acceptance, Labs, Weight trends, Skin, I & O's  REASON FOR ASSESSMENT:   Other (Comment)(Low BMI)    ASSESSMENT:   48 y.o. male with SOB attributable to right pneumothorax with tension component s/p placement of chest tube in ED  RD working remotely.  Suspect pt with poor appetite and oral intake at baseline as pt is underweight. There is some mention of etoh abuse per chart review from 2018. Pt does appear weight stable pta. Pt is currently NPO. RD will add supplements and MVI once diet advanced.   Pt at high risk for malnutrition but unable to diagnose at this time as NFPE cannot be performed.     Medications reviewed and include: Lovenox, nicotine, cefepime, vancomycin   Labs reviewed: Na 134(L) Wbc- 14.7(H)  Unable to complete Nutrition-Focused physical exam at this time.   Diet Order:   Diet Order            Diet NPO time specified  Diet effective midnight             EDUCATION NEEDS:   No education needs have been identified at this time  Skin:  Skin Assessment: Reviewed RN Assessment  Last BM:  10/18  Height:   Ht Readings from Last 1 Encounters:  04/20/19 6\' 2"  (1.88 m)    Weight:   Wt Readings from Last 1 Encounters:  04/21/19 63.4 kg    Ideal Body Weight:  86.3 kg  BMI:  Body mass index is 17.95 kg/m.  Estimated Nutritional Needs:   Kcal:  2000-2300kcal/day  Protein:  100-115g/day  Fluid:  >1.9L/day  Koleen Distance MS, RD, LDN Pager #- [phone] Office#- [phone] After Hours  Pager: [phone]

## 2019-04-22 ENCOUNTER — Inpatient Hospital Stay: Payer: Self-pay

## 2019-04-22 LAB — BASIC METABOLIC PANEL
Anion gap: 8 (ref 5–15)
BUN: 9 mg/dL (ref 6–20)
CO2: 25 mmol/L (ref 22–32)
Calcium: 8.7 mg/dL — ABNORMAL LOW (ref 8.9–10.3)
Chloride: 104 mmol/L (ref 98–111)
Creatinine, Ser: 0.71 mg/dL (ref 0.61–1.24)
GFR calc Af Amer: >60 mL/min (ref >60)
GFR calc non Af Amer: >60 mL/min (ref >60)
Glucose, Bld: 102 mg/dL — ABNORMAL HIGH (ref 70–99)
Potassium: 3.8 mmol/L (ref 3.5–5.1)
Sodium: 137 mmol/L (ref 135–145)

## 2019-04-22 LAB — URINE CULTURE: Culture: NO GROWTH

## 2019-04-22 MED ORDER — PNEUMOCOCCAL 13-VAL CONJ VACC IM SUSP
0.5000 mL | INTRAMUSCULAR | Status: DC | PRN
  Filled 2019-04-22: qty 0.5

## 2019-04-22 MED ORDER — ADULT MULTIVITAMIN W/MINERALS CH
1.0000 | ORAL_TABLET | Freq: Every day | ORAL | Status: DC
  Administered 2019-04-22 – 2019-04-29 (×7): 1 via ORAL
  Filled 2019-04-22 (×7): qty 1

## 2019-04-22 MED ORDER — IOHEXOL 300 MG/ML  SOLN
75.0000 mL | Freq: Once | INTRAMUSCULAR | Status: AC | PRN
  Administered 2019-04-22: 75 mL via INTRAVENOUS

## 2019-04-22 MED ORDER — PNEUMOCOCCAL 13-VAL CONJ VACC IM SUSP
0.5000 mL | INTRAMUSCULAR | Status: AC | PRN
  Administered 2019-04-29: 0.5 mL via INTRAMUSCULAR
  Filled 2019-04-22 (×2): qty 0.5

## 2019-04-22 MED ORDER — SODIUM CHLORIDE 0.9 % IV SOLN
INTRAVENOUS | Status: DC | PRN
  Administered 2019-04-22 – 2019-04-23 (×2): 250 mL via INTRAVENOUS

## 2019-04-22 MED ORDER — ENSURE ENLIVE PO LIQD
237.0000 mL | Freq: Three times a day (TID) | ORAL | Status: DC
  Administered 2019-04-22 – 2019-04-29 (×18): 237 mL via ORAL

## 2019-04-22 NOTE — Progress Notes (Signed)
  Patient ID: Jose Andrews, male   DOB: Mar 03, 1971, 48 y.o.   MRN: [id number]  HISTORY: Overall he states that he feels better than when he was first admitted.  Today he did develop some shortness of breath just prior to his chest x-ray.  He also states that he no longer heard any bubbles.  After the x-ray was taken he coughed and noticed that there were more bubbles in the Pleur-evac.  I wonder if the tube might have been kinked or clogged to cause the chest x-ray from today which have independently reviewed to show a slightly larger pneumothorax on the right.  He does still have an air leak.  His lungs are otherwise clear.   Vitals:   04/22/19 0351 04/22/19 0755  BP: 115/81 115/78  Pulse: 72 74  Resp: 19 18  Temp: 98.6 F (37 C) 98.8 F (37.1 C)  SpO2: 100% 100%     EXAM:    Resp: Lungs are clear bilaterally.  No respiratory distress, normal effort. Heart:  Regular without murmurs Abd:  Abdomen is soft, non distended and non tender. No masses are palpable.  There is no rebound and no guarding.  Neurological: Alert and oriented to person, place, and time. Coordination normal.  Skin: Skin is warm and dry. No rash noted. No diaphoretic. No erythema. No pallor.  Psychiatric: Normal mood and affect. Normal behavior. Judgment and thought content normal.    ASSESSMENT: Right-sided spontaneous pneumothorax with persistent air leak   PLAN:   I would like to obtain a CT scan of the chest.  Because his lung has collapsed while on waterseal, we will leave his chest tube to suction during transport and during the procedure.  Once that is complete we can then develop a plan for therapy.  I did explain to him that this is likely to evolve either a thoracoscopic right thoracotomy approach for management of his air leak.  He understands.    Nestor Lewandowsky, MDPatient ID: Jose Andrews, male   DOB: 07-06-1970, 48 y.o.   MRN: [id number]

## 2019-04-22 NOTE — Progress Notes (Signed)
[facility] NAME: Jose Andrews    MR#:  [id number]  DATE OF BIRTH:  July 08, 1970  SUBJECTIVE:   Came in with cough and chest pain..found to have tension PTX s/p CT placement. Patient feels a bit better today. Able to eat. No shortness of breath. No fever  REVIEW OF SYSTEMS:   Review of Systems  Constitutional: Negative for chills, fever and weight loss.  HENT: Negative for ear discharge, ear pain and nosebleeds.   Eyes: Negative for blurred vision, pain and discharge.  Respiratory: Positive for cough. Negative for sputum production, shortness of breath, wheezing and stridor.   Cardiovascular: Positive for chest pain. Negative for palpitations, orthopnea and PND.  Gastrointestinal: Negative for abdominal pain, diarrhea, nausea and vomiting.  Genitourinary: Negative for frequency and urgency.  Musculoskeletal: Negative for back pain and joint pain.  Neurological: Negative for sensory change, speech change, focal weakness and weakness.  Psychiatric/Behavioral: Negative for depression and hallucinations. The patient is not nervous/anxious.    Tolerating Diet:yes Tolerating PT: not needed  DRUG ALLERGIES:  No Known Allergies  VITALS:  Blood pressure 115/78, pulse 74, temperature 98.8 F (37.1 C), temperature source Oral, resp. rate 18, height 6\' 2"  (1.88 m), weight 66.5 kg, SpO2 100 %.  PHYSICAL EXAMINATION:   Physical Exam  GENERAL:  48 y.o.-year-old patient lying in the bed with no acute distress.  EYES: Pupils equal, round, reactive to light and accommodation. No scleral icterus. Extraocular muscles intact.  HEENT: Head atraumatic, normocephalic. Oropharynx and nasopharynx clear.  NECK:  Supple, no jugular venous distention. No thyroid enlargement, no tenderness.  LUNGS: Normal breath sounds bilaterally, no wheezing, rales, rhonchi. No use of accessory muscles of respiration.   Right side CT + CARDIOVASCULAR: S1, S2  normal. No murmurs, rubs, or gallops.  ABDOMEN: Soft, nontender, nondistended. Bowel sounds present. No organomegaly or mass.  EXTREMITIES: No cyanosis, clubbing or edema b/l.    NEUROLOGIC: Cranial nerves II through XII are intact. No focal Motor or sensory deficits b/l.   PSYCHIATRIC:  patient is alert and oriented x 3.  SKIN: No obvious rash, lesion, or ulcer.   LABORATORY PANEL:  CBC Recent Labs  Lab 04/21/19 0621  WBC 14.7*  HGB 13.5  HCT 39.5  PLT 314    Chemistries  Recent Labs  Lab 04/20/19 1110  04/22/19 0506  NA 136   < > 137  K 3.8   < > 3.8  CL 101   < > 104  CO2 23   < > 25  GLUCOSE 113*   < > 102*  BUN 12   < > 9  CREATININE 0.88   < > 0.71  CALCIUM 9.7   < > 8.7*  AST 55*  --   --   ALT 41  --   --   ALKPHOS 97  --   --   BILITOT 1.0  --   --    < > = values in this interval not displayed.   Cardiac Enzymes No results for input(s): TROPONINI in the last 168 hours. RADIOLOGY:  Dg Chest 2 View  Result Date: 04/21/2019 CLINICAL DATA:  Sepsis, right-sided pneumothorax, chest tube EXAM: CHEST - 2 VIEW COMPARISON:  04/20/2019 FINDINGS: Substantial interval increase in size of a right-sided pneumothorax, now moderate, approximately 40% volume. Right-sided small bore chest tube remains in position with tip about the apex. Interval increase in dense interstitial opacity of the left lung. The heart  and mediastinum are normal. IMPRESSION: 1. Substantial interval increase in size of a right-sided pneumothorax, now moderate, approximately 40% volume. Right-sided small bore chest tube remains in position with tip about the apex. 2. Interval increase in dense interstitial opacity of the left lung, consistent with worsened infection or edema. These results will be called to the ordering clinician or representative by the Radiologist Assistant, and communication documented in the PACS or zVision Dashboard. Electronically Signed   By: Lauralyn PrimesAlex  Bibbey M.D.   On: 04/21/2019 09:06    Dg Chest Port 1 View  Result Date: 04/22/2019 CLINICAL DATA:  Pneumothorax on right. EXAM: PORTABLE CHEST 1 VIEW COMPARISON:  Radiograph yesterday. FINDINGS: Right pigtail catheter remains with tip directed towards the apex. Right pneumothorax is increased from exam yesterday, small to moderate sized visualized at the apex just above the right fourth rib. Probable right apical bleb. Curvilinear lucency projecting over the right mediastinum may be an additional bleb, this is unchanged from priors. Subcutaneous emphysema in the right chest wall. Slight progression in interstitial opacities throughout the left lung and in the right infrahilar region suggesting re-expansion pulmonary edema. IMPRESSION: 1. Increased size of right pneumothorax, small to moderate, pigtail catheter in place. 2. Increasing interstitial opacities throughout the left lung and right infrahilar region suggesting reexpansion pulmonary edema. 3. Right apical bleb. Possible additional bleb projecting over the right mediastinum. Electronically Signed   By: Narda RutherfordMelanie  Sanford M.D.   On: 04/22/2019 05:11   Dg Chest Port 1 View  Result Date: 04/21/2019 CLINICAL DATA:  Follow-up right pneumothorax EXAM: PORTABLE CHEST 1 VIEW COMPARISON:  Film from earlier in the same day. FINDINGS: Cardiac shadow remains enlarged. The known right-sided pneumothorax is again nearly completely resolved with only a minimal apical component present. Increased interstitial changes are noted primarily within the left lung and the right lung base which may represent some re-expansion edema. No bony abnormality is seen. IMPRESSION: Near complete resolution of right-sided pneumothorax. Mild increased interstitial markings bilaterally worse on the left which may be related to some re-expansion edema. Electronically Signed   By: Alcide CleverMark  Lukens M.D.   On: 04/21/2019 11:23   ASSESSMENT AND PLAN:  Jose Andrews  is a 48 y.o. male with no significant past medical history  presented to emergency room for cough for the last couple of days.  Patient also had fever.  Patient had excessive cough and had chest discomfort he came to the emergency room with short of breath he was evaluated chest x-ray showed pneumothorax  * Right sided Tension pneumothorax -Status post chest tube -Surgery consult for chest tube management with Dr Thelma Bargeoaks -CXR from this am shows 40% volume loss--surgery adjusted suction--some improvement in CXR -CT chest ordered today  *Systemic inflammatory response syndrome -Start broad-spectrum IV antibiotics with cefepime-- change to oral antibiotics in 1 to 2 days -MRSA pCR pending--if negative d/c vanc -Follow-up cultures negative so far  * Emphysema on CXR -pt strongly advised on smoking cessation -prn inhalers  *Tobacco abuse Tobacco cessation counseled the patient for 6 minutes Nicotine patch offered  *DVT prophylaxis subcu Lovenox daily  Case discussed with Care Management/Social Worker. Management plans discussed with the patient and they are in agreement.  CODE STATUS: full  DVT Prophylaxis: lovenox  TOTAL TIME TAKING CARE OF THIS PATIENT: 30** minutes.  >50% time spent on counselling and coordination of care  POSSIBLE D/C IN *?* DAYS, DEPENDING ON CLINICAL CONDITION.  Note: This dictation was prepared with Dragon dictation along with smaller phrase technology.  Any transcriptional errors that result from this process are unintentional.  Enedina Finner M.D on 04/22/2019 at 12:28 PM  Between 7am to 6pm - Pager - [phone]  After 6pm go to [email or web address] - password [email or web address]  [facility]  Office  [phone]  CC: Primary care physician; Patient, No Pcp PerPatient ID: Jose Andrews, male   DOB: January 29, 1971, 48 y.o.   MRN: [id number]

## 2019-04-22 NOTE — Progress Notes (Signed)
Pt c/o sharp 10/10 pain in the area of his chest where the chest tube dressing was. He stated that it was the same pain from earlier in the day. NO PRN meds for pain due, MD Jannifer Franklin made aware. One time dose IV morphine 3 mg ordered. Pt reports success with controlling pain after administration.

## 2019-04-23 ENCOUNTER — Inpatient Hospital Stay: Payer: Self-pay | Admitting: Anesthesiology

## 2019-04-23 ENCOUNTER — Inpatient Hospital Stay: Payer: Self-pay

## 2019-04-23 ENCOUNTER — Encounter: Payer: Self-pay | Admitting: Anesthesiology

## 2019-04-23 ENCOUNTER — Encounter
Admission: EM | Disposition: A | Discharge status: Home / Self Care | Payer: Self-pay | Source: Home / Self Care | Attending: Internal Medicine

## 2019-04-23 HISTORY — PX: THORACOTOMY: SHX5074

## 2019-04-23 HISTORY — PX: VIDEO ASSISTED THORACOSCOPY (VATS)/THOROCOTOMY: SHX6173

## 2019-04-23 SURGERY — VIDEO ASSISTED THORACOSCOPY (VATS)/THOROCOTOMY
Anesthesia: General | Laterality: Right

## 2019-04-23 MED ORDER — MIDAZOLAM HCL 2 MG/2ML IJ SOLN
INTRAMUSCULAR | Status: DC | PRN
  Administered 2019-04-23: 2 mg via INTRAVENOUS

## 2019-04-23 MED ORDER — ONDANSETRON HCL 4 MG/2ML IJ SOLN: 4.0000 mg | Freq: Once | INTRAMUSCULAR | Status: DC | PRN

## 2019-04-23 MED ORDER — FENTANYL CITRATE (PF) 100 MCG/2ML IJ SOLN
INTRAMUSCULAR | Status: AC
  Filled 2019-04-23: qty 2

## 2019-04-23 MED ORDER — BUPIVACAINE LIPOSOME 1.3 % IJ SUSP
INTRAMUSCULAR | Status: DC | PRN
  Administered 2019-04-23: 20 mL

## 2019-04-23 MED ORDER — DEXTROSE-NACL 5-0.45 % IV SOLN
INTRAVENOUS | Status: DC
  Administered 2019-04-23 – 2019-04-24 (×2): via INTRAVENOUS

## 2019-04-23 MED ORDER — PHENYLEPHRINE HCL (PRESSORS) 10 MG/ML IV SOLN
INTRAVENOUS | Status: DC | PRN
  Administered 2019-04-23 (×4): 100 ug via INTRAVENOUS

## 2019-04-23 MED ORDER — FENTANYL CITRATE (PF) 100 MCG/2ML IJ SOLN
INTRAMUSCULAR | Status: DC | PRN
  Administered 2019-04-23 (×2): 50 ug via INTRAVENOUS
  Administered 2019-04-23: 25 ug via INTRAVENOUS
  Administered 2019-04-23 (×2): 50 ug via INTRAVENOUS
  Administered 2019-04-23: 100 ug via INTRAVENOUS
  Administered 2019-04-23 (×2): 50 ug via INTRAVENOUS
  Administered 2019-04-23: 25 ug via INTRAVENOUS

## 2019-04-23 MED ORDER — ALBUTEROL SULFATE (2.5 MG/3ML) 0.083% IN NEBU
2.5000 mg | INHALATION_SOLUTION | RESPIRATORY_TRACT | Status: DC
  Administered 2019-04-23 – 2019-04-26 (×11): 2.5 mg via RESPIRATORY_TRACT
  Filled 2019-04-23 (×13): qty 3

## 2019-04-23 MED ORDER — SUGAMMADEX SODIUM 200 MG/2ML IV SOLN
INTRAVENOUS | Status: DC | PRN
  Administered 2019-04-23: 127.4 mg via INTRAVENOUS

## 2019-04-23 MED ORDER — MIDAZOLAM HCL 2 MG/2ML IJ SOLN
INTRAMUSCULAR | Status: AC
  Filled 2019-04-23: qty 2

## 2019-04-23 MED ORDER — FENTANYL CITRATE (PF) 100 MCG/2ML IJ SOLN
25.0000 ug | INTRAMUSCULAR | Status: DC | PRN
  Administered 2019-04-23 (×4): 25 ug via INTRAVENOUS

## 2019-04-23 MED ORDER — PROPOFOL 500 MG/50ML IV EMUL
INTRAVENOUS | Status: AC
  Filled 2019-04-23: qty 50

## 2019-04-23 MED ORDER — BUPIVACAINE HCL 0.25 % IJ SOLN
INTRAMUSCULAR | Status: DC | PRN
  Administered 2019-04-23: 30 mL

## 2019-04-23 MED ORDER — LABETALOL HCL 5 MG/ML IV SOLN
INTRAVENOUS | Status: AC
  Filled 2019-04-23: qty 4

## 2019-04-23 MED ORDER — GLYCOPYRROLATE PF 0.2 MG/ML IJ SOSY
PREFILLED_SYRINGE | INTRAMUSCULAR | Status: DC | PRN
  Administered 2019-04-23: .2 mg via INTRAVENOUS

## 2019-04-23 MED ORDER — PROPOFOL 10 MG/ML IV BOLUS
INTRAVENOUS | Status: AC
  Filled 2019-04-23: qty 20

## 2019-04-23 MED ORDER — PROPOFOL 10 MG/ML IV BOLUS
INTRAVENOUS | Status: DC | PRN
  Administered 2019-04-23: 60 mg via INTRAVENOUS
  Administered 2019-04-23: 30 mg via INTRAVENOUS
  Administered 2019-04-23: 150 mg via INTRAVENOUS
  Administered 2019-04-23: 30 mg via INTRAVENOUS

## 2019-04-23 MED ORDER — SENNOSIDES-DOCUSATE SODIUM 8.6-50 MG PO TABS
1.0000 | ORAL_TABLET | Freq: Two times a day (BID) | ORAL | Status: DC
  Administered 2019-04-23 – 2019-04-28 (×10): 1 via ORAL
  Filled 2019-04-23 (×11): qty 1

## 2019-04-23 MED ORDER — LABETALOL HCL 5 MG/ML IV SOLN
10.0000 mg | Freq: Once | INTRAVENOUS | Status: AC
  Administered 2019-04-23: 10 mg via INTRAVENOUS

## 2019-04-23 MED ORDER — ROCURONIUM BROMIDE 100 MG/10ML IV SOLN
INTRAVENOUS | Status: DC | PRN
  Administered 2019-04-23: 20 mg via INTRAVENOUS
  Administered 2019-04-23: 60 mg via INTRAVENOUS
  Administered 2019-04-23 (×2): 10 mg via INTRAVENOUS
  Administered 2019-04-23 (×2): 20 mg via INTRAVENOUS

## 2019-04-23 MED ORDER — PROPOFOL 10 MG/ML IV BOLUS
INTRAVENOUS | Status: AC
  Filled 2019-04-23: qty 40

## 2019-04-23 MED ORDER — PROPOFOL 500 MG/50ML IV EMUL
INTRAVENOUS | Status: DC | PRN
  Administered 2019-04-23: 150 ug/kg/min via INTRAVENOUS

## 2019-04-23 MED ORDER — ESMOLOL HCL 100 MG/10ML IV SOLN
INTRAVENOUS | Status: DC | PRN
  Administered 2019-04-23: 40 mg via INTRAVENOUS

## 2019-04-23 MED ORDER — LIDOCAINE HCL (CARDIAC) PF 100 MG/5ML IV SOSY
PREFILLED_SYRINGE | INTRAVENOUS | Status: DC | PRN
  Administered 2019-04-23: 40 mg via INTRATRACHEAL

## 2019-04-23 MED ORDER — ONDANSETRON HCL 4 MG/2ML IJ SOLN
INTRAMUSCULAR | Status: DC | PRN
  Administered 2019-04-23: 4 mg via INTRAVENOUS

## 2019-04-23 MED ORDER — FENTANYL CITRATE (PF) 100 MCG/2ML IJ SOLN
INTRAMUSCULAR | Status: AC
  Administered 2019-04-23: 25 ug via INTRAVENOUS
  Filled 2019-04-23: qty 2

## 2019-04-23 MED ORDER — ALBUTEROL SULFATE (2.5 MG/3ML) 0.083% IN NEBU: 2.5000 mg | INHALATION_SOLUTION | RESPIRATORY_TRACT | Status: DC

## 2019-04-23 MED ORDER — FENTANYL CITRATE (PF) 250 MCG/5ML IJ SOLN
INTRAMUSCULAR | Status: AC
  Filled 2019-04-23: qty 5

## 2019-04-23 MED ORDER — LACTATED RINGERS IV SOLN
INTRAVENOUS | Status: DC | PRN
  Administered 2019-04-23: 13:00:00 via INTRAVENOUS

## 2019-04-23 SURGICAL SUPPLY — 91 items
BENZOIN TINCTURE PRP APPL 2/3 (GAUZE/BANDAGES/DRESSINGS) ×2 IMPLANT
BNDG COHESIVE 4X5 TAN STRL (GAUZE/BANDAGES/DRESSINGS) ×2 IMPLANT
BRONCHOSCOPE PED SLIM DISP (MISCELLANEOUS) ×2 IMPLANT
CANISTER SUCT 1200ML W/VALVE (MISCELLANEOUS) ×2 IMPLANT
CATH  RT ANGL  28F  SOF (CATHETERS) ×1
CATH RT ANGL 28F SOF (CATHETERS) IMPLANT
CATH THOR STR 28F  SOFT WA (CATHETERS) ×1
CATH THOR STR 28F SOFT WA (CATHETERS) IMPLANT
CATH URET ROBINSON 16FR STRL (CATHETERS) ×2 IMPLANT
CHLORAPREP W/TINT 26 (MISCELLANEOUS) ×4 IMPLANT
CNTNR SPEC 2.5X3XGRAD LEK (MISCELLANEOUS) ×2
CONN REDUCER 1/4X3/8 STR (CONNECTOR) ×4
CONN REDUCER 3/8X3/8X3/8Y (CONNECTOR) ×2
CONNECTOR REDUCER 1/4X3/8 STR (CONNECTOR) IMPLANT
CONNECTOR REDUCER 3/8X3/8X3/8Y (CONNECTOR) IMPLANT
CONT SPEC 4OZ STER OR WHT (MISCELLANEOUS) ×2
CONTAINER SPEC 2.5X3XGRAD LEK (MISCELLANEOUS) ×4 IMPLANT
CUTTER ECHEON FLEX ENDO 45 340 (ENDOMECHANICALS) ×2 IMPLANT
DEFOGGER SCOPE WARMER CLEARIFY (MISCELLANEOUS) ×1 IMPLANT
DRAIN CHEST DRY SUCT SGL (MISCELLANEOUS) ×2 IMPLANT
DRAPE C-SECTION (MISCELLANEOUS) ×2 IMPLANT
DRAPE MAG INST 16X20 L/F (DRAPES) ×2 IMPLANT
DRAPE POUCH INSTRU U-SHP 10X18 (DRAPES) ×2 IMPLANT
DRSG OPSITE POSTOP 3X4 (GAUZE/BANDAGES/DRESSINGS) ×6 IMPLANT
DRSG OPSITE POSTOP 4X6 (GAUZE/BANDAGES/DRESSINGS) ×2 IMPLANT
DRSG OPSITE POSTOP 4X8 (GAUZE/BANDAGES/DRESSINGS) ×2 IMPLANT
DRSG TEGADERM 4X10 (GAUZE/BANDAGES/DRESSINGS) ×1 IMPLANT
DRSG TELFA 3X8 NADH (GAUZE/BANDAGES/DRESSINGS) ×2 IMPLANT
ELECT BLADE 6 FLAT ULTRCLN (ELECTRODE) ×2 IMPLANT
ELECT BLADE 6.5 EXT (BLADE) ×2 IMPLANT
ELECT CAUTERY BLADE TIP 2.5 (TIP) ×2
ELECT REM PT RETURN 9FT ADLT (ELECTROSURGICAL) ×2
ELECTRODE CAUTERY BLDE TIP 2.5 (TIP) ×1 IMPLANT
ELECTRODE REM PT RTRN 9FT ADLT (ELECTROSURGICAL) ×1 IMPLANT
GAUZE SPONGE 4X4 12PLY STRL (GAUZE/BANDAGES/DRESSINGS) ×2 IMPLANT
GLOVE SURG SYN 7.5  E (GLOVE) ×2
GLOVE SURG SYN 7.5 E (GLOVE) ×2 IMPLANT
GLOVE SURG SYN 7.5 PF PI (GLOVE) ×2 IMPLANT
GOWN STRL REUS W/ TWL LRG LVL3 (GOWN DISPOSABLE) ×4 IMPLANT
GOWN STRL REUS W/TWL LRG LVL3 (GOWN DISPOSABLE) ×3
KIT TURNOVER KIT A (KITS) ×2 IMPLANT
LABEL OR SOLS (LABEL) ×2 IMPLANT
LOOP RED MAXI  1X406MM (MISCELLANEOUS) ×1
LOOP VESSEL MAXI 1X406 RED (MISCELLANEOUS) ×1 IMPLANT
MARKER SKIN DUAL TIP RULER LAB (MISCELLANEOUS) ×3 IMPLANT
NDL SPNL 20GX3.5 QUINCKE YW (NEEDLE) ×1 IMPLANT
NDL SPNL 22GX3.5 QUINCKE BK (NEEDLE) ×1 IMPLANT
NEEDLE SPNL 20GX3.5 QUINCKE YW (NEEDLE) ×2 IMPLANT
NEEDLE SPNL 22GX3.5 QUINCKE BK (NEEDLE) ×2 IMPLANT
NS IRRIG 500ML POUR BTL (IV SOLUTION) ×1 IMPLANT
PACK BASIN MAJOR ARMC (MISCELLANEOUS) ×2 IMPLANT
PAD DRESSING TELFA 3X8 NADH (GAUZE/BANDAGES/DRESSINGS) ×1 IMPLANT
RELOAD PROXIMATE TA60MM GREEN (ENDOMECHANICALS) IMPLANT
RELOAD STAPLE 35X2.5 WHT THIN (STAPLE) ×4 IMPLANT
RELOAD STAPLE 45 GOLD REG/THCK (STAPLE) IMPLANT
RELOAD STAPLE 60 4.7 GRN THCK (ENDOMECHANICALS) IMPLANT
RELOAD STAPLER LINE PROX 30 GR (STAPLE) IMPLANT
SCISSORS METZENBAUM CVD 33 (INSTRUMENTS) IMPLANT
SOL ANTI-FOG 6CC FOG-OUT (MISCELLANEOUS) IMPLANT
SOL FOG-OUT ANTI-FOG 6CC (MISCELLANEOUS) ×1
SPONGE KITTNER 5P (MISCELLANEOUS) ×3 IMPLANT
STAPLE RELOAD 2.5MM WHITE (STAPLE) IMPLANT
STAPLE RELOAD 45MM GOLD (STAPLE) ×22 IMPLANT
STAPLER RELOAD LINE PROX 30 GR (STAPLE)
STAPLER RELOADABLE 30 GRN THCK (STAPLE) ×1 IMPLANT
STAPLER SKIN PROX 35W (STAPLE) ×2 IMPLANT
STAPLER VASCULAR ECHELON 35 (CUTTER) ×2 IMPLANT
STRIP CLOSURE SKIN 1/2X4 (GAUZE/BANDAGES/DRESSINGS) ×2 IMPLANT
SUT ETHILON 4-0 (SUTURE)
SUT ETHILON 4-0 FS2 18XMFL BLK (SUTURE)
SUT MNCRL AB 3-0 PS2 27 (SUTURE) ×4 IMPLANT
SUT PROLENE 5 0 RB 1 DA (SUTURE) IMPLANT
SUT SILK 0 (SUTURE) ×1
SUT SILK 0 30XBRD TIE 6 (SUTURE) ×1 IMPLANT
SUT SILK 1 SH (SUTURE) ×14 IMPLANT
SUT VIC AB 0 CT1 36 (SUTURE) ×4 IMPLANT
SUT VIC AB 2-0 CT1 27 (SUTURE) ×2
SUT VIC AB 2-0 CT1 TAPERPNT 27 (SUTURE) ×2 IMPLANT
SUT VIC AB 2-0 CT2 27 (SUTURE) ×4 IMPLANT
SUT VICRYL 2 TP 1 (SUTURE) ×8 IMPLANT
SUTURE ETHLN 4-0 FS2 18XMF BLK (SUTURE) IMPLANT
SYR 10ML SLIP (SYRINGE) ×3 IMPLANT
SYR BULB IRRIG 60ML STRL (SYRINGE) ×2 IMPLANT
TAPE CLOTH 3X10 WHT NS LF (GAUZE/BANDAGES/DRESSINGS) ×2 IMPLANT
TAPE TRANSPORE STRL 2 31045 (GAUZE/BANDAGES/DRESSINGS) ×2 IMPLANT
TRAY FOLEY MTR SLVR 16FR STAT (SET/KITS/TRAYS/PACK) ×2 IMPLANT
TROCAR FLEXIPATH 20X80 (ENDOMECHANICALS) IMPLANT
TROCAR FLEXIPATH THORACIC 15MM (ENDOMECHANICALS) ×3 IMPLANT
TUBING CONNECTING 10 (TUBING) ×2 IMPLANT
WATER STERILE IRR 1000ML POUR (IV SOLUTION) ×2 IMPLANT
YANKAUER SUCT BULB TIP FLEX NO (MISCELLANEOUS) ×2 IMPLANT

## 2019-04-23 NOTE — Progress Notes (Signed)
Pt blood pressure 149/100   Labetalol 10mg  given for this   Heart rate 103

## 2019-04-23 NOTE — Anesthesia Preprocedure Evaluation (Addendum)
Anesthesia Evaluation  Patient identified by MRN, date of birth, ID band Patient awake    Reviewed: Allergy & Precautions, NPO status , Patient's Chart, lab work & pertinent test results, reviewed documented beta blocker date and time   Airway Mallampati: II  TM Distance: >3 FB     Dental  (+) Chipped, Missing   Pulmonary Current Smoker,           Cardiovascular      Neuro/Psych    GI/Hepatic   Endo/Other    Renal/GU      Musculoskeletal   Abdominal   Peds  Hematology   Anesthesia Other Findings Smokes. EKG shows LVH.  Reproductive/Obstetrics                            Anesthesia Physical Anesthesia Plan  ASA: III  Anesthesia Plan: General   Post-op Pain Management:    Induction: Intravenous  PONV Risk Score and Plan:   Airway Management Planned: Oral ETT and Double Lumen EBT  Additional Equipment:   Intra-op Plan:   Post-operative Plan:   Informed Consent: I have reviewed the patients History and Physical, chart, labs and discussed the procedure including the risks, benefits and alternatives for the proposed anesthesia with the patient or authorized representative who has indicated his/her understanding and acceptance.       Plan Discussed with: CRNA  Anesthesia Plan Comments:         Anesthesia Quick Evaluation

## 2019-04-23 NOTE — Anesthesia Post-op Follow-up Note (Signed)
Anesthesia QCDR form completed.        

## 2019-04-23 NOTE — Transfer of Care (Signed)
Immediate Anesthesia Transfer of Care Note  Patient: Jose Andrews  Procedure(s) Performed: VIDEO ASSISTED THORACOSCOPY (VATS) (Right ) POSSIBLE THORACOTOMY MAJOR (Right )  Patient Location: PACU  Anesthesia Type:General  Level of Consciousness: awake, alert  and oriented  Airway & Oxygen Therapy: Patient Spontanous Breathing and Patient connected to face mask oxygen  Post-op Assessment: Report given to RN and Post -op Vital signs reviewed and stable  Post vital signs: Reviewed and stable  Last Vitals:  Vitals Value Taken Time  BP 149/105 04/23/19 1703  Temp    Pulse 99 04/23/19 1709  Resp 20 04/23/19 1709  SpO2 100 % 04/23/19 1709  Vitals shown include unvalidated device data.  Last Pain:  Vitals:   04/23/19 1300  TempSrc: Tympanic  PainSc: 0-No pain      Patients Stated Pain Goal: 2 (06/24/81 5003)  Complications: No apparent anesthesia complications

## 2019-04-23 NOTE — Progress Notes (Signed)
Pt states that he has pain starting on right shoulder to right side where incision is

## 2019-04-23 NOTE — Progress Notes (Signed)
Patient ID: Jose Andrews, male   DOB: 01/24/1971, 48 y.o.   MRN: [id number]  I had a long discussion with Mr. Mullens yesterday afternoon about the results of his CT scan.  I also went over the images with him in person.  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  I reviewed the option of right thoracoscopy/possible thoracotomy with bullectomy and possible talc pleurodesis.  Risks of bleeding, infection, air leak, death were reviewed.  Risk of recurrence of his pneumothorax also reviewed.  I discussed the postoperative management as well.  All questions answered.  Will plan for surgery today.   

## 2019-04-23 NOTE — Anesthesia Procedure Notes (Signed)
Procedure Name: Intubation Performed by: Staci Acosta, CRNA Pre-anesthesia Checklist: Patient identified, Emergency Drugs available, Suction available and Patient being monitored Patient Re-evaluated:Patient Re-evaluated prior to induction Oxygen Delivery Method: Circle system utilized Preoxygenation: Pre-oxygenation with 100% oxygen Induction Type: IV induction Ventilation: Mask ventilation without difficulty Laryngoscope Size: McGraph and 3 Grade View: Grade I Tube type: Oral Endobronchial tube: Left, Double lumen EBT and EBT position confirmed by fiberoptic bronchoscope and 39 Fr Number of attempts: 1

## 2019-04-23 NOTE — Progress Notes (Signed)
15 minute call to floor. 

## 2019-04-23 NOTE — Interval H&P Note (Signed)
History and Physical Interval Note:  04/23/2019 12:58 PM  Jose Andrews  has presented today for surgery, with the diagnosis of N/A.  The various methods of treatment have been discussed with the patient and family. After consideration of risks, benefits and other options for treatment, the patient has consented to  Procedure(s): VIDEO ASSISTED THORACOSCOPY (VATS) (Right) POSSIBLE THORACOTOMY MAJOR (Right) as a surgical intervention.  The patient's history has been reviewed, patient examined, no change in status, stable for surgery.  I have reviewed the patient's chart and labs.  Questions were answered to the patient's satisfaction.     Nestor Lewandowsky

## 2019-04-23 NOTE — Interval H&P Note (Signed)
History and Physical Interval Note:  04/23/2019 12:58 PM  Jose Andrews  has presented today for surgery, with the diagnosis of N/A.  The various methods of treatment have been discussed with the patient and family. After consideration of risks, benefits and other options for treatment, the patient has consented to  Procedure(s): VIDEO ASSISTED THORACOSCOPY (VATS) (Right) POSSIBLE THORACOTOMY MAJOR (Right) as a surgical intervention.  The patient's history has been reviewed, patient examined, no change in status, stable for surgery.  I have reviewed the patient's chart and labs.  Questions were answered to the patient's satisfaction.     [name]   

## 2019-04-23 NOTE — Anesthesia Procedure Notes (Signed)
Arterial Line Insertion Performed by: Gunnar Bulla, MD, Liv Rallis, Loreen Freud, CRNA, CRNA  Right, radial was placed Catheter size: 20 G  Attempts: 1 Procedure performed without using ultrasound guided technique.

## 2019-04-23 NOTE — Op Note (Signed)
  04/20/2019 - 04/23/2019  4:27 PM  PATIENT:  Jose Andrews  48 y.o. male  PRE-OPERATIVE DIAGNOSIS: Giant bulla with persistent air leak  POST-OPERATIVE DIAGNOSIS: Same  PROCEDURE: Preoperative bronchoscopy to assess endobronchial anatomy; right thoracoscopy with giant bullectomy right upper lobe x4; right middle lobe x2  SURGEON:  Surgeon(s) and Role:    Nestor Lewandowsky, MD - Primary  ASSISTANTS: Loree Fee, PA  ANESTHESIA: General  INDICATIONS FOR PROCEDURE this patient is a 48 year old gentleman who presented with acute shortness of breath and fever.  His chest x-ray showed a large right-sided pneumothorax.  A chest tube was inserted with expansion of the lung.  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  There is a persistent air leak and he was offered the above named operation for management of his bullous lung disease and his air leak.  The indications and risks were explained the patient gave his informed consent.  DICTATION: Patient was brought to the operating suite and placed in the supine position.  General endotracheal anesthesia was given through a double-lumen tube.  Preoperative bronchoscopy was carried out and was normal to the subsegmental levels bilaterally.  There is no evidence of purulent secretions or tumor.  Patient was then turned for a right thoracoscopy.  The right lung was deflated and the chest tube was then removed.  Patient was prepped and draped in usual sterile fashion.  We used 315 mm ports.  These were created in a triangular fashion on the lower aspect of the right hemithorax.  The first port was placed just lateral to the nipple and once this port was placed the other 2 were placed under direct visualization using the scope.  Once the ports were created we then turned our attention to the lung.  There were some adhesions medially and these were taken down with electrocautery.  The apex of the lung was then examined and was found to have  significant bullous lung disease as was the more medial aspect.  All this was excised using multiple firings of the stapler.  There were some smaller blebs on the right upper lobe as well and these were also removed.  The middle lobe had 2 large areas of bullous lung disease and these were resected as well.  The lower lobe was free of any significant disease.  The chest was irrigated.  2 chest tubes were inserted.  We used our port sites for the chest entry site and selected to new skin incisions for her chest tube exit sites.  The tubes were secured.  The chest was again irrigated and hemostasis was evaluated using the scope.  All staple lines were clean and dry.  Port sites were clean and dry.  We then used X. Burrell for pain control on the intercostal incisions.  The wounds were then closed in multiple layers using running absorbable sutures.  The skin was closed with Monocryl and Dermabond.  Sterile dressings were applied around the chest tubes.  The patient was then rolled in the supine position where he was extubated and taken to the recovery room in stable condition.     Nestor Lewandowsky, MD

## 2019-04-23 NOTE — Progress Notes (Signed)
[facility] [place] NAME: Jose Andrews    MR#:  [id number]  DATE OF BIRTH:  Jul 22, 1970  SUBJECTIVE:   Came in with cough and chest pain, found to have tension PTX s/p Chest tube placement.  SOB is improved. Still has cough and chest pain OR later today  REVIEW OF SYSTEMS:   Review of Systems  Constitutional: Negative for chills, fever and weight loss.  HENT: Negative for ear discharge, ear pain and nosebleeds.   Eyes: Negative for blurred vision, pain and discharge.  Respiratory: Positive for cough. Negative for sputum production, shortness of breath, wheezing and stridor.   Cardiovascular: Positive for chest pain. Negative for palpitations, orthopnea and PND.  Gastrointestinal: Negative for abdominal pain, diarrhea, nausea and vomiting.  Genitourinary: Negative for frequency and urgency.  Musculoskeletal: Negative for back pain and joint pain.  Neurological: Negative for sensory change, speech change, focal weakness and weakness.  Psychiatric/Behavioral: Negative for depression and hallucinations. The patient is not nervous/anxious.    Tolerating Diet:yes Tolerating PT: not needed  DRUG ALLERGIES:  No Known Allergies  VITALS:  Blood pressure 118/79, pulse 69, temperature 98.6 F (37 C), temperature source Oral, resp. rate 18, height 6\' 2"  (1.88 m), weight 63.7 kg, SpO2 97 %.  PHYSICAL EXAMINATION:   Physical Exam  GENERAL:  48 y.o.-year-old patient lying in the bed with no acute distress.  EYES: Pupils equal, round, reactive to light and accommodation. No scleral icterus. Extraocular muscles intact.  HEENT: Head atraumatic, normocephalic. Oropharynx and nasopharynx clear.  NECK:  Supple, no jugular venous distention. No thyroid enlargement, no tenderness.  LUNGS: Normal breath sounds bilaterally, no wheezing, rales, rhonchi. No use of accessory muscles of respiration.   Right side CT + CARDIOVASCULAR: S1, S2 normal.  No murmurs, rubs, or gallops.  ABDOMEN: Soft, nontender, nondistended. Bowel sounds present. No organomegaly or mass.  EXTREMITIES: No cyanosis, clubbing or edema b/l.    NEUROLOGIC: Cranial nerves II through XII are intact. No focal Motor or sensory deficits b/l.   PSYCHIATRIC:  patient is alert and oriented x 3.  SKIN: No obvious rash, lesion, or ulcer.   LABORATORY PANEL:  CBC Recent Labs  Lab 04/21/19 0621  WBC 14.7*  HGB 13.5  HCT 39.5  PLT 314    Chemistries  Recent Labs  Lab 04/20/19 1110  04/22/19 0506  NA 136   < > 137  K 3.8   < > 3.8  CL 101   < > 104  CO2 23   < > 25  GLUCOSE 113*   < > 102*  BUN 12   < > 9  CREATININE 0.88   < > 0.71  CALCIUM 9.7   < > 8.7*  AST 55*  --   --   ALT 41  --   --   ALKPHOS 97  --   --   BILITOT 1.0  --   --    < > = values in this interval not displayed.   Cardiac Enzymes No results for input(s): TROPONINI in the last 168 hours. RADIOLOGY:  Ct Chest W Contrast  Result Date: 04/22/2019 CLINICAL DATA:  Follow-up pneumothorax EXAM: CT CHEST WITH CONTRAST TECHNIQUE: Multidetector CT imaging of the chest was performed during intravenous contrast administration. CONTRAST:  78mL OMNIPAQUE IOHEXOL 300 MG/ML  SOLN COMPARISON:  Chest radiographs, 04/22/2019 FINDINGS: Cardiovascular: Aortic atherosclerosis. Normal heart size. No pericardial effusion. Mediastinum/Nodes: No enlarged mediastinal, hilar, or axillary lymph nodes.  Thyroid gland, trachea, and esophagus demonstrate no significant findings. Lungs/Pleura: Small, less than 10% right pneumothorax with a right sided small bore chest tube positioned about the right pulmonary apex. There are numerous paraseptal blebs bilaterally, larger and more numerous in the right lung. No significant centrilobular emphysema. There is extensive, left greater than right bilateral pulmonary ground-glass opacity, most conspicuous in the left upper lobe. Small bilateral pleural effusions and associated  atelectasis or consolidation. Upper Abdomen: No acute abnormality. Musculoskeletal: Subcutaneous emphysema about the right chest wall. No chest wall mass or suspicious bone lesions identified. IMPRESSION: 1. Small, less than 10% right pneumothorax with a right sided small bore chest tube positioned about the right pulmonary apex. 2. There are numerous paraseptal blebs bilaterally, larger and more numerous in the right lung. No significant centrilobular emphysema. 3. There is extensive, left greater than right bilateral pulmonary ground-glass opacity, most conspicuous in the left upper lobe, in keeping with multifocal infection. 4. Small bilateral pleural effusions and associated atelectasis or consolidation. 5.  Aortic Atherosclerosis (ICD10-I70.0). Electronically Signed   By: Lauralyn Primes M.D.   On: 04/22/2019 13:50   Dg Chest Port 1 View  Result Date: 04/22/2019 CLINICAL DATA:  Pneumothorax on right. EXAM: PORTABLE CHEST 1 VIEW COMPARISON:  Radiograph yesterday. FINDINGS: Right pigtail catheter remains with tip directed towards the apex. Right pneumothorax is increased from exam yesterday, small to moderate sized visualized at the apex just above the right fourth rib. Probable right apical bleb. Curvilinear lucency projecting over the right mediastinum may be an additional bleb, this is unchanged from priors. Subcutaneous emphysema in the right chest wall. Slight progression in interstitial opacities throughout the left lung and in the right infrahilar region suggesting re-expansion pulmonary edema. IMPRESSION: 1. Increased size of right pneumothorax, small to moderate, pigtail catheter in place. 2. Increasing interstitial opacities throughout the left lung and right infrahilar region suggesting reexpansion pulmonary edema. 3. Right apical bleb. Possible additional bleb projecting over the right mediastinum. Electronically Signed   By: Narda Rutherford M.D.   On: 04/22/2019 05:11   ASSESSMENT AND PLAN:   Jose Andrews  is a 48 y.o. male with no significant past medical history presented to emergency room for cough for the last couple of days.  Patient also had fever.  Patient had excessive cough and had chest discomfort he came to the emergency room with short of breath he was evaluated chest x-ray showed pneumothorax  * Right sided Tension pneumothorax -Status post chest tube -Surgery consult for chest tube management with Dr Thelma Barge. -Ct chest with multiple bullae. OR later today.   *Systemic inflammatory response syndrome -Start broad-spectrum IV antibiotics with cefepime -- change to oral antibiotics in 1 to 2 days -Follow-up cultures negative so far  * Emphysema on CXR - pt strongly advised on smoking cessation - prn inhalers  *Tobacco abuse Tobacco cessation counseled earlier in admission  *DVT prophylaxis subcu Lovenox daily  Case discussed with Care Management/Social Worker. Management plans discussed with the patient and they are in agreement.  CODE STATUS: Full  DVT Prophylaxis: lovenox  TOTAL TIME TAKING CARE OF THIS PATIENT: 30 minutes.   Note: This dictation was prepared with Dragon dictation along with smaller phrase technology. Any transcriptional errors that result from this process are unintentional.  Orie Fisherman M.D on 04/23/2019 at 12:22 PM  Between 7am to 6pm - Pager - [phone]  After 6pm go to [email or web address] - Social research officer, government  [employer]  [phone]  CC: Primary care physician; Patient, No Pcp PerPatient ID: Jose Andrews, male   DOB: 01/28/1971, 48 y.o.   MRN: [id number]

## 2019-04-23 NOTE — H&P (View-Only) (Signed)
Patient ID: Jose Andrews, male   DOB: 1970-09-24, 48 y.o.   MRN: [id number]  I had a long discussion with Mr. Mccollam yesterday afternoon about the results of his CT scan.  I also went over the images with him in person.  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  I reviewed the option of right thoracoscopy/possible thoracotomy with bullectomy and possible talc pleurodesis.  Risks of bleeding, infection, air leak, death were reviewed.  Risk of recurrence of his pneumothorax also reviewed.  I discussed the postoperative management as well.  All questions answered.  Will plan for surgery today.

## 2019-04-24 ENCOUNTER — Inpatient Hospital Stay: Payer: Self-pay

## 2019-04-24 ENCOUNTER — Encounter: Payer: Self-pay | Admitting: Cardiothoracic Surgery

## 2019-04-24 LAB — BASIC METABOLIC PANEL
Anion gap: 9 (ref 5–15)
BUN: 10 mg/dL (ref 6–20)
CO2: 27 mmol/L (ref 22–32)
Calcium: 8.8 mg/dL — ABNORMAL LOW (ref 8.9–10.3)
Chloride: 100 mmol/L (ref 98–111)
Creatinine, Ser: 0.64 mg/dL (ref 0.61–1.24)
GFR calc Af Amer: >60 mL/min (ref >60)
GFR calc non Af Amer: >60 mL/min (ref >60)
Glucose, Bld: 139 mg/dL — ABNORMAL HIGH (ref 70–99)
Potassium: 3.6 mmol/L (ref 3.5–5.1)
Sodium: 136 mmol/L (ref 135–145)

## 2019-04-24 LAB — CBC
HCT: 33.4 % — ABNORMAL LOW (ref 39.0–52.0)
Hemoglobin: 11.6 g/dL — ABNORMAL LOW (ref 13.0–17.0)
MCH: 31.2 pg (ref 26.0–34.0)
MCHC: 34.7 g/dL (ref 30.0–36.0)
MCV: 89.8 fL (ref 80.0–100.0)
Platelets: 390 10*3/uL (ref 150–400)
RBC: 3.72 MIL/uL — ABNORMAL LOW (ref 4.22–5.81)
RDW: 13.6 % (ref 11.5–15.5)
WBC: 13.8 10*3/uL — ABNORMAL HIGH (ref 4.0–10.5)
nRBC: 0 % (ref 0.0–0.2)

## 2019-04-24 MED ORDER — OXYCODONE-ACETAMINOPHEN 5-325 MG PO TABS
1.0000 | ORAL_TABLET | ORAL | Status: DC | PRN
  Administered 2019-04-24 – 2019-04-28 (×12): 2 via ORAL
  Administered 2019-04-28: 1 via ORAL
  Administered 2019-04-28 – 2019-04-29 (×2): 2 via ORAL
  Filled 2019-04-24 (×15): qty 2

## 2019-04-24 MED ORDER — TRAMADOL HCL 50 MG PO TABS
50.0000 mg | ORAL_TABLET | Freq: Four times a day (QID) | ORAL | Status: DC
  Administered 2019-04-24 – 2019-04-29 (×21): 50 mg via ORAL
  Filled 2019-04-24 (×22): qty 1

## 2019-04-24 MED ORDER — CHLORHEXIDINE GLUCONATE CLOTH 2 % EX PADS
6.0000 | MEDICATED_PAD | Freq: Every day | CUTANEOUS | Status: DC
  Administered 2019-04-24 – 2019-04-29 (×6): 6 via TOPICAL

## 2019-04-24 MED ORDER — MORPHINE SULFATE (PF) 2 MG/ML IV SOLN
1.0000 mg | INTRAVENOUS | Status: DC | PRN
  Administered 2019-04-24: 1 mg via INTRAVENOUS
  Administered 2019-04-24 – 2019-04-26 (×5): 2 mg via INTRAVENOUS
  Filled 2019-04-24 (×6): qty 1

## 2019-04-24 MED ORDER — ENOXAPARIN SODIUM 40 MG/0.4ML ~~LOC~~ SOLN
40.0000 mg | SUBCUTANEOUS | Status: DC
  Filled 2019-04-24 (×3): qty 0.4

## 2019-04-24 MED ORDER — AMOXICILLIN-POT CLAVULANATE 875-125 MG PO TABS
1.0000 | ORAL_TABLET | Freq: Two times a day (BID) | ORAL | Status: AC
  Administered 2019-04-25 – 2019-04-27 (×6): 1 via ORAL
  Filled 2019-04-24 (×6): qty 1

## 2019-04-24 NOTE — Anesthesia Postprocedure Evaluation (Signed)
Anesthesia Post Note  Patient: Jose Andrews  Procedure(s) Performed: VIDEO ASSISTED THORACOSCOPY (VATS) (Right ) POSSIBLE THORACOTOMY MAJOR (Right )  Patient location during evaluation: PACU Anesthesia Type: General Level of consciousness: awake and alert and oriented Pain management: pain level controlled Vital Signs Assessment: post-procedure vital signs reviewed and stable Respiratory status: spontaneous breathing Cardiovascular status: blood pressure returned to baseline Anesthetic complications: no     Last Vitals:  Vitals:   04/24/19 0416 04/24/19 1227  BP: 122/77 130/79  Pulse: 74 83  Resp: 18 (!) 24  Temp: 37 C 36.9 C  SpO2: 100% 99%    Last Pain:  Vitals:   04/24/19 1529  TempSrc:   PainSc: 8                  Rithvik Orcutt

## 2019-04-24 NOTE — Evaluation (Signed)
Physical Therapy Evaluation Patient Details Name: Jose Andrews MRN: [id number] DOB: [birth year] Today's Date: 04/24/2019   History of Present Illness  48 y.o. male with no significant past medical history presented to emergency room for cough for the last couple of days.  Patient also had fever.  Patient had excessive cough and had chest discomfort he came to the emergency room with short of breath he was evaluated chest x-ray showed pneumothorax.  Patient has been longtime smoker and chest tube was placed.    Clinical Impression  Pt was able to do all physical aspects of PT w/o direct assist.  However, he was pain limited and did have significant increase in pain with increased activity, especially prolonged ambulation.  He did not have any overt balance or safety issues and did not need UEs/AD during 200 ft of ambulation but was very slow and guarded (far from his baseline) with the effort.  Chest tube on water seal during ambulation, sats stayed in the high 90s.      Follow Up Recommendations Follow surgeon's recommendation for DC plan and follow-up therapies(likely will not need PT once pain more controlled)    Equipment Recommendations  None recommended by PT    Recommendations for Other Services       Precautions / Restrictions Precautions Precautions: (R chest tube) Restrictions Weight Bearing Restrictions: No      Mobility  Bed Mobility Overal bed mobility: Independent             General bed mobility comments: Pt with some expected guarding secondary to rib/chest pain but able to move in bed independently  Transfers Overall transfer level: Independent Equipment used: None             General transfer comment: Pt was able to rise to standing w/o assist, again somewhat guarded but fully independent  Ambulation/Gait Ambulation/Gait assistance: Modified independent (Device/Increase time) Gait Distance (Feet): 200 Feet Assistive device: None        General Gait Details: Pt intially wanting to hold IV/hand for stability; quickly became confident enough to go w/o and was able to maintain cautious/guarded consistent speed. No excessive fatigue (O2 in the high 90s on room air) but increasing pain with increasing time standing and quite painful by the time we got back to the room.  Stairs            Wheelchair Mobility    Modified Rankin (Stroke Patients Only)       Balance Overall balance assessment: Modified Independent                                           Pertinent Vitals/Pain Pain Assessment: 0-10 Pain Score: 8  Pain Location: Pt with increasing pain during prolonged ambulation, in quite a bit of pain post session, at tube insertion site.    Home Living Family/patient expects to be discharged to:: Private residence Living Arrangements: Alone Available Help at Discharge: Family(sister lives locally and can help, friends have offered too)   Home Access: Stairs to enter   Technical brewer of Steps: 1   Home Equipment: None      Prior Function Level of Independence: Independent         Comments: Pt works, is active, able to do all he needs w/o issue     Hand Dominance        Extremity/Trunk Assessment  Upper Extremity Assessment Upper Extremity Assessment: Overall WFL for tasks assessed(R UE limited secondary to chest tube pain/limitations)    Lower Extremity Assessment Lower Extremity Assessment: Overall WFL for tasks assessed       Communication   Communication: No difficulties  Cognition Arousal/Alertness: Awake/alert Behavior During Therapy: WFL for tasks assessed/performed Overall Cognitive Status: Within Functional Limits for tasks assessed                                        General Comments      Exercises     Assessment/Plan    PT Assessment Patient needs continued PT services  PT Problem List         PT Treatment  Interventions Gait training;Therapeutic exercise;Therapeutic activities;Functional mobility training;Patient/family education    PT Goals (Current goals can be found in the Care Plan section)  Acute Rehab PT Goals Patient Stated Goal: go home PT Goal Formulation: With patient Time For Goal Achievement: 05/08/19 Potential to Achieve Goals: Fair    Frequency Min 2X/week   Barriers to discharge        Co-evaluation               AM-PAC PT "6 Clicks" Mobility  Outcome Measure Help needed turning from your back to your side while in a flat bed without using bedrails?: None Help needed moving from lying on your back to sitting on the side of a flat bed without using bedrails?: None Help needed moving to and from a bed to a chair (including a wheelchair)?: None Help needed standing up from a chair using your arms (e.g., wheelchair or bedside chair)?: None Help needed to walk in hospital room?: None Help needed climbing 3-5 steps with a railing? : None 6 Click Score: 24    End of Session Equipment Utilized During Treatment: Gait belt Activity Tolerance: Patient limited by pain Patient left: in chair;with call bell/phone within reach Nurse Communication: (need for pain meds) PT Visit Diagnosis: Pain;Difficulty in walking, not elsewhere classified (R26.2);Muscle weakness (generalized) (M62.81) Pain - Right/Left: Right Pain - part of body: (thoracic)    Time: 7628-3151 PT Time Calculation (min) (ACUTE ONLY): 18 min   Charges:   PT Evaluation $PT Eval Low Complexity: 1 Low PT Treatments $Gait Training: 8-22 mins        Malachi Pro, DPT 04/24/2019, 4:41 PM

## 2019-04-24 NOTE — Progress Notes (Signed)
Patient ID: Jose Andrews, male   DOB: February 14, 1971, 48 y.o.   MRN: [id number]  Complaining of pain at incision sites.  VSS, afebrile  No air leak  Wounds clean and dry Lungs clear but slightly diminished on right Heart regular  CXRay from this morning looks good  Will manage acute pain with IV morphine and begin Tramadol Will encourage out of bed with physical therapy DC Foley, IV fluids, and oxygen

## 2019-04-24 NOTE — Progress Notes (Signed)
[facility] [place] [facility]   PATIENT NAME: Jose Andrews    MR#:  [id number]  DATE OF BIRTH:  02/07/1971  SUBJECTIVE:   Came in with cough and chest pain, found to have tension PTX s/p Chest tube placement.  Severe pain at surgical site   right thoracoscopy with giant bullectomy right upper lobe x4; right middle lobe x2 - 04/23/2019  REVIEW OF SYSTEMS:   Review of Systems  Constitutional: Negative for chills, fever and weight loss.  HENT: Negative for ear discharge, ear pain and nosebleeds.   Eyes: Negative for blurred vision, pain and discharge.  Respiratory: Positive for cough. Negative for sputum production, shortness of breath, wheezing and stridor.   Cardiovascular: Positive for chest pain. Negative for palpitations, orthopnea and PND.  Gastrointestinal: Negative for abdominal pain, diarrhea, nausea and vomiting.  Genitourinary: Negative for frequency and urgency.  Musculoskeletal: Negative for back pain and joint pain.  Neurological: Negative for sensory change, speech change, focal weakness and weakness.  Psychiatric/Behavioral: Negative for depression and hallucinations. The patient is not nervous/anxious.    Tolerating Diet:yes  DRUG ALLERGIES:  No Known Allergies  VITALS:  Blood pressure 122/77, pulse 74, temperature 98.6 F (37 C), resp. rate 18, height 6\' 2"  (1.88 m), weight 63.7 kg, SpO2 100 %.  PHYSICAL EXAMINATION:   Physical Exam  GENERAL:  48 y.o.-year-old patient lying in the bed with no acute distress.  EYES: Pupils equal, round, reactive to light and accommodation. No scleral icterus. Extraocular muscles intact.  HEENT: Head atraumatic, normocephalic. Oropharynx and nasopharynx clear.  NECK:  Supple, no jugular venous distention. No thyroid enlargement, no tenderness.  LUNGS: Normal breath sounds bilaterally, no wheezing, rales, rhonchi. No use of accessory muscles of respiration.   Right side CT + CARDIOVASCULAR:  S1, S2 normal. No murmurs, rubs, or gallops.  ABDOMEN: Soft, nontender, nondistended. Bowel sounds present. No organomegaly or mass.  EXTREMITIES: No cyanosis, clubbing or edema b/l.    NEUROLOGIC: Cranial nerves II through XII are intact. No focal Motor or sensory deficits b/l.   PSYCHIATRIC:  patient is alert and oriented x 3.  SKIN: No obvious rash, lesion, or ulcer.   LABORATORY PANEL:  CBC Recent Labs  Lab 04/24/19 0357  WBC 13.8*  HGB 11.6*  HCT 33.4*  PLT 390    Chemistries  Recent Labs  Lab 04/20/19 1110  04/24/19 0357  NA 136   < > 136  K 3.8   < > 3.6  CL 101   < > 100  CO2 23   < > 27  GLUCOSE 113*   < > 139*  BUN 12   < > 10  CREATININE 0.88   < > 0.64  CALCIUM 9.7   < > 8.8*  AST 55*  --   --   ALT 41  --   --   ALKPHOS 97  --   --   BILITOT 1.0  --   --    < > = values in this interval not displayed.   Cardiac Enzymes No results for input(s): TROPONINI in the last 168 hours. RADIOLOGY:  Ct Chest W Contrast  Result Date: 04/22/2019 CLINICAL DATA:  Follow-up pneumothorax EXAM: CT CHEST WITH CONTRAST TECHNIQUE: Multidetector CT imaging of the chest was performed during intravenous contrast administration. CONTRAST:  75mL OMNIPAQUE IOHEXOL 300 MG/ML  SOLN COMPARISON:  Chest radiographs, 04/22/2019 FINDINGS: Cardiovascular: Aortic atherosclerosis. Normal heart size. No pericardial effusion. Mediastinum/Nodes: No enlarged mediastinal, hilar, or  axillary lymph nodes. Thyroid gland, trachea, and esophagus demonstrate no significant findings. Lungs/Pleura: Small, less than 10% right pneumothorax with a right sided small bore chest tube positioned about the right pulmonary apex. There are numerous paraseptal blebs bilaterally, larger and more numerous in the right lung. No significant centrilobular emphysema. There is extensive, left greater than right bilateral pulmonary ground-glass opacity, most conspicuous in the left upper lobe. Small bilateral pleural effusions  and associated atelectasis or consolidation. Upper Abdomen: No acute abnormality. Musculoskeletal: Subcutaneous emphysema about the right chest wall. No chest wall mass or suspicious bone lesions identified. IMPRESSION: 1. Small, less than 10% right pneumothorax with a right sided small bore chest tube positioned about the right pulmonary apex. 2. There are numerous paraseptal blebs bilaterally, larger and more numerous in the right lung. No significant centrilobular emphysema. 3. There is extensive, left greater than right bilateral pulmonary ground-glass opacity, most conspicuous in the left upper lobe, in keeping with multifocal infection. 4. Small bilateral pleural effusions and associated atelectasis or consolidation. 5.  Aortic Atherosclerosis (ICD10-I70.0). Electronically Signed   By: Eddie Candle M.D.   On: 04/22/2019 13:50   Dg Chest Port 1 View  Result Date: 04/24/2019 CLINICAL DATA:  Postop EXAM: PORTABLE CHEST 1 VIEW COMPARISON:  04/23/2019 FINDINGS: Two right chest tubes remain in place, unchanged. Postoperative changes in the right lung. Suspect small pneumothorax in the medial right upper hemithorax. Heart is normal size. Mild interstitial prominence throughout the lungs, stable since prior study. No visible effusions. IMPRESSION: Postoperative changes on the right with right pneumothorax. Suspect small medial right apical pneumothorax. Electronically Signed   By: Rolm Baptise M.D.   On: 04/24/2019 08:08   Dg Chest Port 1 View  Result Date: 04/23/2019 CLINICAL DATA:  Status post right thoracoscopy EXAM: PORTABLE CHEST 1 VIEW COMPARISON:  04/22/2019 FINDINGS: Previously seen small bore chest tube is been removed in 2 large bore chest tubes are now seen. No significant residual pneumothorax is noted. Persistent ground-glass opacities are noted left greater than right similar to that seen on prior CT examination. No acute bony abnormality is noted. IMPRESSION: No definitive pneumothorax.  Chest  tubes are noted in place. Electronically Signed   By: Inez Catalina M.D.   On: 04/23/2019 17:23   ASSESSMENT AND PLAN:  Jose Andrews  is a 48 y.o. male with no significant past medical history presented to emergency room for cough for the last couple of days.  Patient also had fever.  Patient had excessive cough and had chest discomfort he came to the emergency room with short of breath he was evaluated chest x-ray showed pneumothorax  * Right sided Tension pneumothorax -Status post chest tube -Surgery consult for chest tube management with Dr Genevive Bi. -Ct chest with multiple bullae.  S/p  right thoracoscopy with giant bullectomy right upper lobe x4; right middle lobe x2 on 04/23/2019  *Systemic inflammatory response syndrome -Start broad-spectrum IV antibiotics with cefepime -- change to oral antibiotics tomorrow -Follow-up cultures negative so far  * Emphysema on CXR - pt strongly advised on smoking cessation - prn inhalers  *Tobacco abuse Tobacco cessation counseled earlier in admission  *DVT prophylaxis subcutaneous Lovenox daily  Case discussed with Care Management/Social Worker. Management plans discussed with the patient and they are in agreement.  CODE STATUS: Full  TOTAL TIME TAKING CARE OF THIS PATIENT: 30 minutes.   Note: This dictation was prepared with Dragon dictation along with smaller phrase technology. Any transcriptional errors that result from this process  are unintentional.  Orie Fisherman M.D on 04/24/2019 at 11:50 AM  Between 7am to 6pm - Pager - [phone]  After 6pm go to [email or web address] - password [email or web address]  [facility]  Office  [phone]  CC: Primary care physician; Patient, No Pcp PerPatient ID: Jose Andrews, male   DOB: 1970/07/14, 48 y.o.   MRN: [id number]

## 2019-04-25 LAB — CULTURE, BLOOD (ROUTINE X 2)
Culture: NO GROWTH
Culture: NO GROWTH
Special Requests: ADEQUATE
Special Requests: ADEQUATE

## 2019-04-25 LAB — ACID FAST SMEAR (AFB, MYCOBACTERIA)
Acid Fast Smear: NEGATIVE
Acid Fast Smear: NEGATIVE

## 2019-04-25 NOTE — Progress Notes (Signed)
PT Cancellation Note  Patient Details Name: Jose Andrews MRN: [id number] DOB: [birth year]   Cancelled Treatment:    Reason Eval/Treat Not Completed: Other (comment)   Pt in recliner, stated he has walked with nursing staff this am - to elevators.  Stated he plans to walk again today.  Reports improved mobility.  Will continue as appropriate.   Chesley Noon 04/25/2019, 12:16 PM

## 2019-04-25 NOTE — Progress Notes (Signed)
[facility] Day(s): 5.   Post op day(s): 2 Days Post-Op.   Interval History: Patient seen and examined, no acute events or new complaints overnight. Patient reports feeling well.  She does report having pain on the incision site.  The pain does not radiate to other part of the body.  The pain is aggravated by coughing and laughing.  Alleviating factor is current pain medications.  Denies shortness of breath.  Patient able to ambulate.  Reports tolerating diet.  Vital signs in last 24 hours: [min-max] current  Temp:  [98 F (36.7 C)-98.9 F (37.2 C)] 98.9 F (37.2 C) (10/24 0435) Pulse Rate:  [73-83] 73 (10/24 0816) Resp:  [16-24] 16 (10/24 0816) BP: (127-130)/(79-93) 127/87 (10/24 0435) SpO2:  [96 %-100 %] 97 % (10/24 0816)     Height: 6\' 2"  (188 cm) Weight: 63.7 kg BMI (Calculated): 18.03   Chest tube: 300 mL in the study 4 hours  Physical Exam:  Constitutional: alert, cooperative and no distress  Chest: Chest tube in place.  Wounds covered dry and clean. Respiratory: breathing non-labored at rest  Cardiovascular: regular rate and sinus rhythm  Gastrointestinal: soft, non-tender, and non-distended  Labs:  CBC Latest Ref Rng & Units 04/24/2019 04/21/2019 04/20/2019  WBC 4.0 - 10.5 K/uL 13.8(H) 14.7(H) 21.7(H)  Hemoglobin 13.0 - 17.0 g/dL 11.6(L) 13.5 14.6  Hematocrit 39.0 - 52.0 % 33.4(L) 39.5 42.6  Platelets 150 - 400 K/uL 390 314 341   CMP Latest Ref Rng & Units 04/24/2019 04/22/2019 04/21/2019  Glucose 70 - 99 mg/dL 139(H) 102(H) 102(H)  BUN 6 - 20 mg/dL 10 9 14   Creatinine 0.61 - 1.24 mg/dL 0.64 0.71 0.97  Sodium 135 - 145 mmol/L 136 137 134(L)  Potassium 3.5 - 5.1 mmol/L 3.6 3.8 4.7  Chloride 98 - 111 mmol/L 100 104 103  CO2 22 - 32 mmol/L 27 25 25   Calcium 8.9 - 10.3 mg/dL 8.8(L) 8.7(L) 8.6(L)  Total Protein 6.5 - 8.1 g/dL - - -  Total Bilirubin 0.3 - 1.2 mg/dL - - -  Alkaline Phos 38 - 126 U/L - - -  AST 15 - 41 U/L - - -  ALT 0 - 44 U/L  - - -    Imaging studies: No new pertinent imaging studies   Assessment/Plan:  48 y.o. 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  Patient recovering properly for surgery.  Still with persistent pain in incision site.  We will continue with current pain medication.  Patient needs to stay with chest tube for the next 48 hours.  Possible removal of chest tube in 48 hours.  Patient encouraged to ambulate.  No contraindication for DVT prophylaxis.  Encouraged to perform incentive spirometer.  Agree with current management by medical service.  Arnold Long, MD

## 2019-04-25 NOTE — Progress Notes (Signed)
[facility] [place] at [facility]   PATIENT NAME: Jose Andrews    MR#:  [id number]  DATE OF BIRTH:  1971/07/01  SUBJECTIVE:   Came in with cough and chest pain, found to have tension PTX s/p Chest tube placement.  Severe pain at surgical site continues.  Worsened by movement, coughing.  Improved with pain meds. Wants to ambulate later today   right thoracoscopy with giant bullectomy right upper lobe x4; right middle lobe x2 - 04/23/2019  REVIEW OF SYSTEMS:   Review of Systems  Constitutional: Negative for chills, fever and weight loss.  HENT: Negative for ear discharge, ear pain and nosebleeds.   Eyes: Negative for blurred vision, pain and discharge.  Respiratory: Positive for cough. Negative for sputum production, shortness of breath, wheezing and stridor.   Cardiovascular: Positive for chest pain. Negative for palpitations, orthopnea and PND.  Gastrointestinal: Negative for abdominal pain, diarrhea, nausea and vomiting.  Genitourinary: Negative for frequency and urgency.  Musculoskeletal: Negative for back pain and joint pain.  Neurological: Negative for sensory change, speech change, focal weakness and weakness.  Psychiatric/Behavioral: Negative for depression and hallucinations. The patient is not nervous/anxious.    Tolerating Diet:yes  DRUG ALLERGIES:  No Known Allergies  VITALS:  Blood pressure 127/87, pulse 73, temperature 98.9 F (37.2 C), temperature source Oral, resp. rate 16, height 6\' 2"  (1.88 m), weight 63.7 kg, SpO2 97 %.  PHYSICAL EXAMINATION:   Physical Exam  GENERAL:  48 y.o.-year-old patient lying in the bed with no acute distress.  EYES: Pupils equal, round, reactive to light and accommodation. No scleral icterus. Extraocular muscles intact.  HEENT: Head atraumatic, normocephalic. Oropharynx and nasopharynx clear.  NECK:  Supple, no jugular venous distention. No thyroid enlargement, no tenderness.  LUNGS: Normal breath  sounds bilaterally, no wheezing, rales, rhonchi. No use of accessory muscles of respiration.   Right side CT + CARDIOVASCULAR: S1, S2 normal. No murmurs, rubs, or gallops.  ABDOMEN: Soft, nontender, nondistended. Bowel sounds present. No organomegaly or mass.  EXTREMITIES: No cyanosis, clubbing or edema b/l.    NEUROLOGIC: Cranial nerves II through XII are intact. No focal Motor or sensory deficits b/l.   PSYCHIATRIC:  patient is alert and oriented x 3.  SKIN: No obvious rash, lesion, or ulcer.   LABORATORY PANEL:  CBC Recent Labs  Lab 04/24/19 0357  WBC 13.8*  HGB 11.6*  HCT 33.4*  PLT 390    Chemistries  Recent Labs  Lab 04/20/19 1110  04/24/19 0357  NA 136   < > 136  K 3.8   < > 3.6  CL 101   < > 100  CO2 23   < > 27  GLUCOSE 113*   < > 139*  BUN 12   < > 10  CREATININE 0.88   < > 0.64  CALCIUM 9.7   < > 8.8*  AST 55*  --   --   ALT 41  --   --   ALKPHOS 97  --   --   BILITOT 1.0  --   --    < > = values in this interval not displayed.   Cardiac Enzymes No results for input(s): TROPONINI in the last 168 hours. RADIOLOGY:  Dg Chest Port 1 View  Result Date: 04/24/2019 CLINICAL DATA:  Postop EXAM: PORTABLE CHEST 1 VIEW COMPARISON:  04/23/2019 FINDINGS: Two right chest tubes remain in place, unchanged. Postoperative changes in the right lung. Suspect small pneumothorax in the medial  right upper hemithorax. Heart is normal size. Mild interstitial prominence throughout the lungs, stable since prior study. No visible effusions. IMPRESSION: Postoperative changes on the right with right pneumothorax. Suspect small medial right apical pneumothorax. Electronically Signed   By: Rolm Baptise M.D.   On: 04/24/2019 08:08   Dg Chest Port 1 View  Result Date: 04/23/2019 CLINICAL DATA:  Status post right thoracoscopy EXAM: PORTABLE CHEST 1 VIEW COMPARISON:  04/22/2019 FINDINGS: Previously seen small bore chest tube is been removed in 2 large bore chest tubes are now seen. No  significant residual pneumothorax is noted. Persistent ground-glass opacities are noted left greater than right similar to that seen on prior CT examination. No acute bony abnormality is noted. IMPRESSION: No definitive pneumothorax.  Chest tubes are noted in place. Electronically Signed   By: Inez Catalina M.D.   On: 04/23/2019 17:23   ASSESSMENT AND PLAN:  [name]  is a 48 y.o. male with no significant past medical history presented to emergency room for cough for the last couple of days.  Patient also had fever.  Patient had excessive cough and had chest discomfort he came to the emergency room with short of breath he was evaluated chest x-ray showed pneumothorax  * Right sided Tension pneumothorax -Status post chest tube -Surgery consult for chest tube management with Dr Genevive Bi. -Ct chest with multiple bullae.  S/p  right thoracoscopy with giant bullectomy right upper lobe x4; right middle lobe x2 on 04/23/2019 Pain medications as needed  *Systemic inflammatory response syndrome -IV cefepime changed to Augmentin today. -Follow-up cultures negative so far  * Emphysema on CXR - prn inhalers  *Tobacco abuse Tobacco cessation counseled earlier in admission  *DVT prophylaxis subcutaneous Lovenox daily  Case discussed with Care Management/Social Worker. Management plans discussed with the patient and they are in agreement.  CODE STATUS: Full  TOTAL TIME TAKING CARE OF THIS PATIENT: 30 minutes.   Note: This dictation was prepared with Dragon dictation along with smaller phrase technology. Any transcriptional errors that result from this process are unintentional.  Neita Carp M.D on 04/25/2019 at 11:22 AM  Between 7am to 6pm - Pager - [phone]  After 6pm go to [email or web address] - password [email or web address] [facility]  Office  [phone]  CC: Primary care physician; Patient, No Pcp PerPatient ID: Jose Andrews, male   DOB: 02/07/1971, 48 y.o.   MRN:  [id number]

## 2019-04-26 MED ORDER — ALBUTEROL SULFATE (2.5 MG/3ML) 0.083% IN NEBU: 2.5000 mg | INHALATION_SOLUTION | RESPIRATORY_TRACT | Status: DC | PRN

## 2019-04-26 NOTE — Progress Notes (Signed)
[facility] - [place] at [facility]   PATIENT NAME: Jose Andrews    MR#:  [id number]  DATE OF BIRTH:  01/02/1971  SUBJECTIVE:   Came in with cough and chest pain, found to have tension PTX s/p Chest tube placement.   right thoracoscopy with giant bullectomy right upper lobe x4; right middle lobe x2 - 04/23/2019  Continues to have severe pain with chest tube.  Improved with pain medications.  Up and ambulating.  Good appetite.  REVIEW OF SYSTEMS:   Review of Systems  Constitutional: Negative for chills, fever and weight loss.  HENT: Negative for ear discharge, ear pain and nosebleeds.   Eyes: Negative for blurred vision, pain and discharge.  Respiratory: Positive for cough. Negative for sputum production, shortness of breath, wheezing and stridor.   Cardiovascular: Positive for chest pain. Negative for palpitations, orthopnea and PND.  Gastrointestinal: Negative for abdominal pain, diarrhea, nausea and vomiting.  Genitourinary: Negative for frequency and urgency.  Musculoskeletal: Negative for back pain and joint pain.  Neurological: Negative for sensory change, speech change, focal weakness and weakness.  Psychiatric/Behavioral: Negative for depression and hallucinations. The patient is not nervous/anxious.    Tolerating Diet:yes  DRUG ALLERGIES:  No Known Allergies  VITALS:  Blood pressure 120/85, pulse 70, temperature 98.7 F (37.1 C), temperature source Oral, resp. rate 20, height 6\' 2"  (1.88 m), weight 63.7 kg, SpO2 99 %.  PHYSICAL EXAMINATION:   Physical Exam  GENERAL:  48 y.o.-year-old patient lying in the bed with no acute distress.  EYES: Pupils equal, round, reactive to light and accommodation. No scleral icterus. Extraocular muscles intact.  HEENT: Head atraumatic, normocephalic. Oropharynx and nasopharynx clear.  NECK:  Supple, no jugular venous distention. No thyroid enlargement, no tenderness.  LUNGS: Normal breath sounds  bilaterally, no wheezing, rales, rhonchi. No use of accessory muscles of respiration.  Right side CT + CARDIOVASCULAR: S1, S2 normal. No murmurs, rubs, or gallops.  ABDOMEN: Soft, nontender, nondistended. Bowel sounds present. No organomegaly or mass.  EXTREMITIES: No cyanosis, clubbing or edema b/l.    NEUROLOGIC: Cranial nerves II through XII are intact. No focal Motor or sensory deficits b/l.   PSYCHIATRIC:  patient is alert and oriented x 3.  SKIN: No obvious rash, lesion, or ulcer.   LABORATORY PANEL:  CBC Recent Labs  Lab 04/24/19 0357  WBC 13.8*  HGB 11.6*  HCT 33.4*  PLT 390    Chemistries  Recent Labs  Lab 04/20/19 1110  04/24/19 0357  NA 136   < > 136  K 3.8   < > 3.6  CL 101   < > 100  CO2 23   < > 27  GLUCOSE 113*   < > 139*  BUN 12   < > 10  CREATININE 0.88   < > 0.64  CALCIUM 9.7   < > 8.8*  AST 55*  --   --   ALT 41  --   --   ALKPHOS 97  --   --   BILITOT 1.0  --   --    < > = values in this interval not displayed.   Cardiac Enzymes No results for input(s): TROPONINI in the last 168 hours. RADIOLOGY:  No results found. ASSESSMENT AND PLAN:  Jose Andrews  is a 48 y.o. male with no significant past medical history presented to emergency room for cough for the last couple of days.  Patient also had fever.  Patient had excessive cough and  had chest discomfort he came to the emergency room with short of breath he was evaluated chest x-ray showed pneumothorax  * Right sided Tension pneumothorax -Status post chest tube -Surgery consult for chest tube management with Dr Genevive Bi. -Ct chest with multiple bullae.  S/p  right thoracoscopy with giant bullectomy right upper lobe x4; right middle lobe x2 on 04/23/2019 Pain medications as needed  * Systemic inflammatory response syndrome -IV cefepime changed to Augmentin today. -Follow-up cultures negative so far  * Emphysema on CXR - PRN inhalers  * Tobacco abuse Tobacco cessation counseled earlier in  admission  * DVT prophylaxis subcutaneous Lovenox daily  Case discussed with Care Management/Social Worker. Management plans discussed with the patient and they are in agreement.  CODE STATUS: Full  TOTAL TIME TAKING CARE OF THIS PATIENT: 30 minutes.   Note: This dictation was prepared with Dragon dictation along with smaller phrase technology. Any transcriptional errors that result from this process are unintentional.  Neita Carp M.D on 04/26/2019 at 10:47 AM  Between 7am to 6pm - Pager - [phone]  After 6pm go to [email or web address] - password [email or web address] [facility]  Office  [phone]  CC: Primary care physician; Patient, No Pcp PerPatient ID: Jose Andrews, male   DOB: Sep 16, 1970, 48 y.o.   MRN: [id number]

## 2019-04-26 NOTE — Progress Notes (Signed)
[place] Hospital Day(s): 6.   Post op day(s): 3 Days Post-Op.   Interval History: Patient seen and examined, no acute events or new complaints overnight. Patient reports feeling well.  He does report pain on the incision area.  Sometimes when he moves his and aggravating factor around the tubes.  He denies shortness of breath.  He denies chest pain or the intended tube area.  He denies palpitations.  He reports that he has been able to ambulate.  Vital signs in last 24 hours: [min-max] current  Temp:  [97.7 F (36.5 C)-98.7 F (37.1 C)] 97.7 F (36.5 C) (10/25 1155) Pulse Rate:  [70-75] 70 (10/25 1155) Resp:  [16-20] 18 (10/25 1155) BP: (113-126)/(81-85) 113/81 (10/25 1155) SpO2:  [99 %-100 %] 100 % (10/25 1155)     Height: 6\' 2"  (188 cm) Weight: 63.7 kg BMI (Calculated): 18.03    Physical Exam:  Constitutional: alert, cooperative and no distress  Respiratory: breathing non-labored at rest  Cardiovascular: regular rate and sinus rhythm  Chest: Right chest tube in place, there is no air leak. Gastrointestinal: soft, non-tender, and non-distended  Labs:  CBC Latest Ref Rng & Units 04/24/2019 04/21/2019 04/20/2019  WBC 4.0 - 10.5 K/uL 13.8(H) 14.7(H) 21.7(H)  Hemoglobin 13.0 - 17.0 g/dL 11.6(L) 13.5 14.6  Hematocrit 39.0 - 52.0 % 33.4(L) 39.5 42.6  Platelets 150 - 400 K/uL 390 314 341   CMP Latest Ref Rng & Units 04/24/2019 04/22/2019 04/21/2019  Glucose 70 - 99 mg/dL 139(H) 102(H) 102(H)  BUN 6 - 20 mg/dL 10 9 14   Creatinine 0.61 - 1.24 mg/dL 0.64 0.71 0.97  Sodium 135 - 145 mmol/L 136 137 134(L)  Potassium 3.5 - 5.1 mmol/L 3.6 3.8 4.7  Chloride 98 - 111 mmol/L 100 104 103  CO2 22 - 32 mmol/L 27 25 25   Calcium 8.9 - 10.3 mg/dL 8.8(L) 8.7(L) 8.6(L)  Total Protein 6.5 - 8.1 g/dL - - -  Total Bilirubin 0.3 - 1.2 mg/dL - - -  Alkaline Phos 38 - 126 U/L - - -  AST 15 - 41 U/L - - -  ALT 0 - 44 U/L - - -    Imaging studies: No new pertinent imaging  studies   Assessment/Plan:  48 y.o. male with giant bulla with persistent air leak 3 Days Post-Op s/p bullectomy of the right upper lobe, right middle lobe, complicated by pertinent comorbidities including smoker.  Patient recovering properly.  There is no air leak in chest tube.  We will continue with chest tube to suction for today with the plan tomorrow morning placing it at waterseal and performing a new chest x-ray.  If pneumothorax does not resolve will consider to remove chest tube.  Patient will continue care with Dr. Genevive Bi, thoracic surgeon.  I encouraged the patient to ambulate and perform incentive parameter.  Agreed to continue with current pain medication, DVT prophylaxis and incentive spirometer.  Arnold Long, MD

## 2019-04-27 ENCOUNTER — Inpatient Hospital Stay: Payer: Self-pay

## 2019-04-27 NOTE — Progress Notes (Signed)
Patient ID: Jose Andrews, male   DOB: 20-Nov-1970, 48 y.o.   MRN: [id number]  Overall he had a very quiet evening.  He was able to walk in the halls without any shortness of breath.  His pain is much improved.  He does not have an air leak today.  His chest x-ray shows the lung to be fully expanded.  I did remove the posterior chest tube.  We will place the remaining chest tube on waterseal and repeat a chest x-ray.  His wounds are changed and they are all clean dry and intact.  If his x-ray today looks good we will plan on removing his remaining chest tube tomorrow.  Tim [employer]

## 2019-04-27 NOTE — Progress Notes (Signed)
Nutrition Follow Up Note   DOCUMENTATION CODES:   Underweight  INTERVENTION:   Ensure Enlive po TID, each supplement provides 350 kcal and 20 grams of protein  MVI daily   NUTRITION DIAGNOSIS:   Predicted suboptimal nutrient intake related to social / environmental circumstances as evidenced by other (comment)(pt is underweight).  GOAL:   Patient will meet greater than or equal to 90% of their needs  -progressing   MONITOR:   PO intake, Supplement acceptance, Labs, Weight trends, Skin, I & O's  ASSESSMENT:   48 y.o. male with SOB attributable to right pneumothorax with tension component s/p placement of chest tube in ED  S/p right thoracoscopy with giant bullectomy right upper lobe x4; right middle lobe x2 on 04/23/2019  Pt with improved appetite and oral intake; pt eating 100% of meals and drinking Ensure. Pt s/p removal of one chest tube today. Plan to to remove the other chest tube tomorrow. Pt may be able to discharge tomorrow per MD note.   Per chart, pt is weight stable since admit.   Medications reviewed and include: augmentin, Lovenox, nicotine, senokot, tramadol  Labs reviewed: wbc- 13.8(H)  Diet Order:   Diet Order            Diet regular Room service appropriate? Yes; Fluid consistency: Thin  Diet effective now             EDUCATION NEEDS:   No education needs have been identified at this time  Skin:  Skin Assessment: Reviewed RN Assessment  Last BM:  10/24  Height:   Ht Readings from Last 1 Encounters:  04/23/19 6\' 2"  (1.88 m)    Weight:   Wt Readings from Last 1 Encounters:  04/23/19 63.7 kg    Ideal Body Weight:  86.3 kg  BMI:  Body mass index is 18.04 kg/m.  Estimated Nutritional Needs:   Kcal:  2000-2300kcal/day  Protein:  100-115g/day  Fluid:  >1.9L/day  Koleen Distance MS, RD, LDN Pager #- [phone] Office#- [phone] After Hours Pager: [phone]

## 2019-04-27 NOTE — Progress Notes (Signed)
[facility] - [place] at [facility]   PATIENT NAME: Jose Andrews    MR#:  [id number]  DATE OF BIRTH:  October 25, 1970  SUBJECTIVE:   Came in with cough and chest pain, found to have tension PTX s/p Chest tube placement.   right thoracoscopy with giant bullectomy right upper lobe x4; right middle lobe x2 - 04/23/2019  Continues to have severe pain with chest tube.  One of the chest tubes removed today. Ambulating and eating well  REVIEW OF SYSTEMS:   Review of Systems  Constitutional: Negative for chills, fever and weight loss.  HENT: Negative for ear discharge, ear pain and nosebleeds.   Eyes: Negative for blurred vision, pain and discharge.  Respiratory: Positive for cough. Negative for sputum production, shortness of breath, wheezing and stridor.   Cardiovascular: Positive for chest pain. Negative for palpitations, orthopnea and PND.  Gastrointestinal: Negative for abdominal pain, diarrhea, nausea and vomiting.  Genitourinary: Negative for frequency and urgency.  Musculoskeletal: Negative for back pain and joint pain.  Neurological: Negative for sensory change, speech change, focal weakness and weakness.  Psychiatric/Behavioral: Negative for depression and hallucinations. The patient is not nervous/anxious.    Tolerating Diet:yes  DRUG ALLERGIES:  No Known Allergies  VITALS:  Blood pressure 107/71, pulse 65, temperature 98.1 F (36.7 C), temperature source Oral, resp. rate 17, height 6\' 2"  (1.88 m), weight 63.7 kg, SpO2 98 %.  PHYSICAL EXAMINATION:   Physical Exam  GENERAL:  48 y.o.-year-old patient lying in the bed with no acute distress.  EYES: Pupils equal, round, reactive to light and accommodation. No scleral icterus. Extraocular muscles intact.  HEENT: Head atraumatic, normocephalic. Oropharynx and nasopharynx clear.  NECK:  Supple, no jugular venous distention. No thyroid enlargement, no tenderness.  LUNGS: Normal breath sounds  bilaterally, no wheezing, rales, rhonchi. No use of accessory muscles of respiration.  Right side CT + CARDIOVASCULAR: S1, S2 normal. No murmurs, rubs, or gallops.  ABDOMEN: Soft, nontender, nondistended. Bowel sounds present. No organomegaly or mass.  EXTREMITIES: No cyanosis, clubbing or edema b/l.    NEUROLOGIC: Cranial nerves II through XII are intact. No focal Motor or sensory deficits b/l.   PSYCHIATRIC:  patient is alert and oriented x 3.  SKIN: No obvious rash, lesion, or ulcer.   LABORATORY PANEL:  CBC Recent Labs  Lab 04/24/19 0357  WBC 13.8*  HGB 11.6*  HCT 33.4*  PLT 390    Chemistries  Recent Labs  Lab 04/24/19 0357  NA 136  K 3.6  CL 100  CO2 27  GLUCOSE 139*  BUN 10  CREATININE 0.64  CALCIUM 8.8*   Cardiac Enzymes No results for input(s): TROPONINI in the last 168 hours. RADIOLOGY:  Dg Chest 2 View  Result Date: 04/27/2019 CLINICAL DATA:  Follow up to chest tube removal, oxygen and chest tube still in place, smoker EXAM: CHEST - 2 VIEW COMPARISON:  Chest radiograph 04/27/2019 FINDINGS: Stable cardiomediastinal contours. Interval removal of 1 of 2 right chest tubes. The remaining tube is unchanged in position coursing toward the apex. Postoperative changes are again seen in the right upper lobe. No pneumothorax or pleural effusion. The left lung is clear. No acute finding in the visualized skeleton. IMPRESSION: Interval removal of 1 of 2 right chest tubes. No pneumothorax. Electronically Signed   By: 04/29/2019 M.D.   On: 04/27/2019 10:12   Dg Chest Port 1 View  Result Date: 04/27/2019 CLINICAL DATA:  Postop check postop.  Persistent air leak. EXAM:  PORTABLE CHEST 1 VIEW COMPARISON:  04/24/2019 FINDINGS: Two right chest tubes remain in place. Postoperative changes in the right upper lobe. No definite visible residual pneumothorax. Lungs clear. Heart is normal size. No acute bony abnormality. IMPRESSION: Postoperative changes on the right with right  chest tubes in place. No definite visible residual pneumothorax. Electronically Signed   By: Rolm Baptise M.D.   On: 04/27/2019 08:26   ASSESSMENT AND PLAN:  Jose Andrews  is a 48 y.o. male with no significant past medical history presented to emergency room for cough for the last couple of days.  Patient also had fever.  Patient had excessive cough and had chest discomfort he came to the emergency room with short of breath he was evaluated chest x-ray showed pneumothorax  * Right sided Tension pneumothorax -Status post chest tube placement -Surgery consult for chest tube management with Dr Genevive Bi. -Ct chest with multiple bullae.  S/p  right thoracoscopy with giant bullectomy right upper lobe x4; right middle lobe x2 on 04/23/2019 1 chest tube removed today Pain medications as needed  Plan is to discharge home tomorrow after removal of the second chest tube.  *Community-acquired pneumonia -IV cefepime changed to Augmentin.  Will finish antibiotics today -Follow-up cultures negative so far  * Emphysema on CXR - PRN inhalers  * Tobacco abuse Tobacco cessation counseled earlier in admission  * DVT prophylaxis subcutaneous Lovenox daily  Case discussed with Care Management/Social Worker. Management plans discussed with the patient and they are in agreement.  CODE STATUS: Full  TOTAL TIME TAKING CARE OF THIS PATIENT: 30 minutes.   Note: This dictation was prepared with Dragon dictation along with smaller phrase technology. Any transcriptional errors that result from this process are unintentional.  Neita Carp M.D on 04/27/2019 at 11:41 AM  Between 7am to 6pm - Pager - [phone]  After 6pm go to [email or web address] - password [email or web address] [facility]  Office  [phone]  CC: Primary care physician; Patient, No Pcp PerPatient ID: Jose Andrews, male   DOB: 26-Feb-1971, 48 y.o.   MRN: [id number]

## 2019-04-27 NOTE — Progress Notes (Signed)
The patient ambulated in the hallway twice. From the room to the elevator.

## 2019-04-28 LAB — AEROBIC/ANAEROBIC CULTURE W GRAM STAIN (SURGICAL/DEEP WOUND)
Culture: NO GROWTH
Culture: NO GROWTH
Gram Stain: NONE SEEN
Gram Stain: NONE SEEN

## 2019-04-28 LAB — SURGICAL PATHOLOGY

## 2019-04-28 MED ORDER — POLYETHYLENE GLYCOL 3350 17 G PO PACK
17.0000 g | PACK | Freq: Every day | ORAL | Status: DC
  Administered 2019-04-28: 17 g via ORAL
  Filled 2019-04-28: qty 1

## 2019-04-28 NOTE — Progress Notes (Signed)
[facility] - [place] at [facility]   PATIENT NAME: Jose Andrews    MR#:  [id number]  DATE OF BIRTH:  1970-09-11  SUBJECTIVE:   Came in with cough and chest pain, found to have tension PTX s/p Chest tube placement.   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 Both CT removed. Doing well. Sister in the room  REVIEW OF SYSTEMS:   Review of Systems  Constitutional: Negative for chills, fever and weight loss.  HENT: Negative for ear discharge, ear pain and nosebleeds.   Eyes: Negative for blurred vision, pain and discharge.  Respiratory: Positive for cough. Negative for sputum production, shortness of breath, wheezing and stridor.   Cardiovascular: Positive for chest pain. Negative for palpitations, orthopnea and PND.  Gastrointestinal: Negative for abdominal pain, diarrhea, nausea and vomiting.  Genitourinary: Negative for frequency and urgency.  Musculoskeletal: Negative for back pain and joint pain.  Neurological: Negative for sensory change, speech change, focal weakness and weakness.  Psychiatric/Behavioral: Negative for depression and hallucinations. The patient is not nervous/anxious.    Tolerating Diet:yes  DRUG ALLERGIES:  No Known Allergies  VITALS:  Blood pressure 121/81, pulse 74, temperature 98.2 F (36.8 C), temperature source Oral, resp. rate 20, height 6\' 2"  (1.88 m), weight 63.7 kg, SpO2 99 %.  PHYSICAL EXAMINATION:   Physical Exam  GENERAL:  48 y.o.-year-old patient lying in the bed with no acute distress.  EYES: Pupils equal, round, reactive to light and accommodation. No scleral icterus. Extraocular muscles intact.  HEENT: Head atraumatic, normocephalic. Oropharynx and nasopharynx clear.  NECK:  Supple, no jugular venous distention. No thyroid enlargement, no tenderness.  LUNGS: Normal breath sounds bilaterally, no wheezing, rales, rhonchi. No use  of accessory muscles of respiration. CARDIOVASCULAR: S1, S2 normal. No murmurs, rubs, or gallops.  ABDOMEN: Soft, nontender, nondistended. Bowel sounds present. No organomegaly or mass.  EXTREMITIES: No cyanosis, clubbing or edema b/l.    NEUROLOGIC: Cranial nerves II through XII are intact. No focal Motor or sensory deficits b/l.   PSYCHIATRIC:  patient is alert and oriented x 3.  SKIN: No obvious rash, lesion, or ulcer.   LABORATORY PANEL:  CBC Recent Labs  Lab 04/24/19 0357  WBC 13.8*  HGB 11.6*  HCT 33.4*  PLT 390    Chemistries  Recent Labs  Lab 04/24/19 0357  NA 136  K 3.6  CL 100  CO2 27  GLUCOSE 139*  BUN 10  CREATININE 0.64  CALCIUM 8.8*   Cardiac Enzymes No results for input(s): TROPONINI in the last 168 hours. RADIOLOGY:  Dg Chest 2 View  Result Date: 04/27/2019 CLINICAL DATA:  Follow up to chest tube removal, oxygen and chest tube still in place, smoker EXAM: CHEST - 2 VIEW COMPARISON:  Chest radiograph 04/27/2019 FINDINGS: Stable cardiomediastinal contours. Interval removal of 1 of 2 right chest tubes. The remaining tube is unchanged in position coursing toward the apex. Postoperative changes are again seen in the right upper lobe. No pneumothorax or pleural effusion. The left lung is clear. No acute finding in the visualized skeleton. IMPRESSION: Interval removal of 1 of 2 right chest tubes. No pneumothorax. Electronically Signed   By: 04/29/2019 M.D.   On: 04/27/2019 10:12   Dg Chest Port 1 View  Result Date: 04/27/2019 CLINICAL DATA:  Postop check postop.  Persistent air leak. EXAM: PORTABLE CHEST 1 VIEW COMPARISON:  04/24/2019 FINDINGS: Two right chest tubes  remain in place. Postoperative changes in the right upper lobe. No definite visible residual pneumothorax. Lungs clear. Heart is normal size. No acute bony abnormality. IMPRESSION: Postoperative changes on the right with right chest tubes in place. No definite visible residual pneumothorax.  Electronically Signed   By: Rolm Baptise M.D.   On: 04/27/2019 08:26   ASSESSMENT AND PLAN:  [name]  is a 48 y.o. male with no significant past medical history presented to emergency room for cough for the last couple of days.  Patient also had fever.  Patient had excessive cough and had chest discomfort he came to the emergency room with short of breath he was evaluated chest x-ray showed pneumothorax  * Right sided Tension pneumothorax -Status post chest tube placement--now removed -Surgery consult with Dr Genevive Bi. -Ct chest with multiple bullae.  -S/p  right thoracoscopy with giant bullectomy right upper lobe x4; right middle lobe x2 on 04/23/2019 -Pain medications as needed -Doing well. Per Dr Genevive Bi (per family request) observe 1 more day to make sure he remains stbale  *Community-acquired pneumonia -IV cefepime changed to Augmentin.  pt finished antibiotics -Follow-up cultures negative so far  * Emphysema on CXR - PRN inhalers  * Tobacco abuse Tobacco cessation counseled earlier in admission  * DVT prophylaxis subcutaneous Lovenox daily  Anticipate d/c tomorrow   CODE STATUS: Full  TOTAL TIME TAKING CARE OF THIS PATIENT: 30 minutes.   Note: This dictation was prepared with Dragon dictation along with smaller phrase technology. Any transcriptional errors that result from this process are unintentional.  Fritzi Mandes M.D on 04/28/2019 at 11:21 AM  Between 7am to 6pm - Pager - [phone]  After 6pm go to [email or web address] - password [email or web address] [facility]  Office  [phone]  CC: Primary care physician; Patient, No Pcp PerPatient ID: Jose Andrews, male   DOB: [birth year], 48 y.o.   MRN: [id number]

## 2019-04-28 NOTE — Progress Notes (Addendum)
Patient ID: Jose Andrews, male   DOB: 06-21-1971, 48 y.o.   MRN: [id number]   Overall he had a pretty quiet night.  He is not short of breath.  He has been active in the room.  We did remove his posterior chest tube.  His chest x-ray looks fine afterwards.  I spoke to his sister today.  She is very concerned about sending him home today.  She wants him observed in the hospital for 24 hours after the tube is removed.  We will remove his chest tube today.  He may remain in the hospital overnight and be discharged tomorrow based upon the patient and his sisters believe that he would be best served with observation of 24 hours in the hospital after the tube is removed.  I did not appreciate an air leak today.  He had a vigorous cough.  Our plan will be to remove his tube today.  We will repeat an x-ray once the tube is removed.  I will check on his cultures and pathology.

## 2019-04-29 ENCOUNTER — Inpatient Hospital Stay: Payer: Self-pay

## 2019-04-29 MED ORDER — ADULT MULTIVITAMIN W/MINERALS CH: 1.0000 | ORAL_TABLET | Freq: Every day | ORAL | 0 refills | Status: AC

## 2019-04-29 MED ORDER — NICOTINE 21 MG/24HR TD PT24: 21.0000 mg | MEDICATED_PATCH | Freq: Every day | TRANSDERMAL | 0 refills | Status: AC

## 2019-04-29 MED ORDER — OXYCODONE-ACETAMINOPHEN 5-325 MG PO TABS: 1.0000 | ORAL_TABLET | Freq: Three times a day (TID) | ORAL | 0 refills | Status: DC | PRN

## 2019-04-29 MED ORDER — ALBUTEROL SULFATE HFA 108 (90 BASE) MCG/ACT IN AERS: 2.0000 | INHALATION_SPRAY | Freq: Four times a day (QID) | RESPIRATORY_TRACT | 1 refills | Status: AC | PRN

## 2019-04-29 NOTE — TOC Initial Note (Signed)
Transition of Care [facility]) - Initial/Assessment Note    Patient Details  Name: Jose Andrews MRN: [id number] Date of Birth: 15-Sep-1970  Transition of Care [facility]) CM/SW Contact:    Beverly Sessions, RN Phone Number: 04/29/2019, 12:16 PM  Clinical Narrative:                 Patient admitted from home with PNX Sister at bedside  Patient states he lives at home alone.  Is employed, without insurance  [facility]  Chest tubes removed, Patient maintains sat on RA with exertion.   Patient to discharge today.  RNCM provided patient with resources Medication Management, [facility] , "[facility]:  Your Guide to [employer] and [employer] in [place]"  [place]..  Patient agreeable for new patient appointment at [facility]. Appointment made for 05/06/19  Patient to discharge on albuterol and percocet.  RNCM provided good rx coupon.  Patient confirms he will be able to obtain at discharge   RNCM signing off      Expected Discharge Plan: Home/Self Care Barriers to Discharge: Barriers Resolved   Patient Goals and CMS Choice Patient states their goals for this hospitalization and ongoing recovery are:: to get medicaid      Expected Discharge Plan and Services Expected Discharge Plan: Home/Self Care   Discharge Planning Services: CM Consult   Living arrangements for the past 2 months: Single Family Home Expected Discharge Date: 04/29/19                                    Prior Living Arrangements/Services Living arrangements for the past 2 months: Single Family Home Lives with:: Self Patient language and need for interpreter reviewed:: Yes Do you feel safe going back to the place where you live?: Yes      Need for Family Participation in Patient Care: No (Comment) Care giver support system in place?: Yes (comment)   Criminal Activity/Legal Involvement Pertinent to Current Situation/Hospitalization: No - Comment as  needed  Activities of Daily Living Home Assistive Devices/Equipment: None ADL Screening (condition at time of admission) Patient's cognitive ability adequate to safely complete daily activities?: Yes Is the patient deaf or have difficulty hearing?: No Does the patient have difficulty seeing, even when wearing glasses/contacts?: No Does the patient have difficulty concentrating, remembering, or making decisions?: No Patient able to express need for assistance with ADLs?: No Does the patient have difficulty dressing or bathing?: No Independently performs ADLs?: Yes (appropriate for developmental age) Does the patient have difficulty walking or climbing stairs?: No Weakness of Legs: None Weakness of Arms/Hands: None  Permission Sought/Granted                  Emotional Assessment Appearance:: Appears stated age Attitude/Demeanor/Rapport: Gracious Affect (typically observed): Accepting Orientation: : Oriented to Self, Oriented to Place, Oriented to  Time, Oriented to Situation   Psych Involvement: No (comment)  Admission diagnosis:  sob ems Patient Active Problem List   Diagnosis Date Noted  . Pneumothorax on right 04/20/2019   PCP:  Patient, No Pcp Per Pharmacy:   [id number] [place], [place] [address] [place] [address] Phone: [phone] Fax: [phone]     Social Determinants of Health (SDOH) Interventions    Readmission Risk Interventions No flowsheet data found.

## 2019-04-29 NOTE — Progress Notes (Signed)
Discharge instructions given. Pt verbalizes understanding. Pneumonia Vaccine to be given prior to discharge.

## 2019-04-29 NOTE — TOC Initial Note (Signed)
Transition of Care [facility]) - Initial/Assessment Note    Patient Details  Name: Jose Andrews MRN: [id number] Date of Birth: 12-Sep-1970  Transition of Care [facility]) CM/SW Contact:    Beverly Sessions, RN Phone Number: 04/29/2019, 9:23 AM  Clinical Narrative:                  Patient to discharge today.  RNCM attempted to provide coupons and resources.  Patient request that I wait to review until his sister arrives.  Bedside RN notified to let me know when sister arrives          Patient Goals and CMS Choice        Expected Discharge Plan and Services           Expected Discharge Date: 04/29/19                                    Prior Living Arrangements/Services                       Activities of Daily Living Home Assistive Devices/Equipment: None ADL Screening (condition at time of admission) Patient's cognitive ability adequate to safely complete daily activities?: Yes Is the patient deaf or have difficulty hearing?: No Does the patient have difficulty seeing, even when wearing glasses/contacts?: No Does the patient have difficulty concentrating, remembering, or making decisions?: No Patient able to express need for assistance with ADLs?: No Does the patient have difficulty dressing or bathing?: No Independently performs ADLs?: Yes (appropriate for developmental age) Does the patient have difficulty walking or climbing stairs?: No Weakness of Legs: None Weakness of Arms/Hands: None  Permission Sought/Granted                  Emotional Assessment              Admission diagnosis:  sob ems Patient Active Problem List   Diagnosis Date Noted  . Pneumothorax on right 04/20/2019   PCP:  Patient, No Pcp Per Pharmacy:   [id number] [place], [place] [address] Phone: [phone] Fax: [phone]     Social Determinants of Health (SDOH) Interventions    Readmission Risk  Interventions No flowsheet data found.

## 2019-04-29 NOTE — Progress Notes (Addendum)
[name] and O x4. VSS. Pt tolerating diet well. No complaints of nausea or vomiting. IV removed intact, prescriptions given. Pt voices understanding of discharge instructions with no further questions. Patient discharged with family, walked out.  Allergies as of 04/29/2019   No Known Allergies     Medication List    TAKE these medications   acetaminophen 500 MG tablet Commonly known as: TYLENOL Take 500-1,000 mg by mouth every 6 (six) hours as needed for mild pain, moderate pain or fever.   albuterol 108 (90 Base) MCG/ACT inhaler Commonly known as: VENTOLIN HFA Inhale 2 puffs into the lungs every 6 (six) hours as needed for wheezing or shortness of breath.   multivitamin with minerals Tabs tablet Take 1 tablet by mouth daily.   nicotine 21 mg/24hr patch Commonly known as: NICODERM CQ - dosed in mg/24 hours Place 1 patch (21 mg total) onto the skin daily. Start taking on: April 30, 2019   oxyCODONE-acetaminophen 5-325 MG tablet Commonly known as: PERCOCET/ROXICET Take 1 tablet by mouth every 8 (eight) hours as needed for severe pain.       Vitals:   04/29/19 0412 04/29/19 1221  BP: 112/77 120/79  Pulse: 71 79  Resp: 20 15  Temp: 98.7 F (37.1 C) 98.5 F (36.9 C)  SpO2: 99% 100%    Jose Andrews

## 2019-04-29 NOTE — Discharge Summary (Addendum)
[facility]   PATIENT NAME: Jose Andrews    MR#:  [id number]  DATE OF BIRTH:  1971/02/19  DATE OF ADMISSION:  04/20/2019 ADMITTING PHYSICIAN: Jose Austin, MD  DATE OF DISCHARGE: 04/29/2019  PRIMARY CARE PHYSICIAN: Jose Andrews    ADMISSION DIAGNOSIS:  sob ems  DISCHARGE DIAGNOSIS:  *Right tension Pneumothorax s/p chest tube placement *S/p Right Thoracoscopy with [facility] Bullectomy Right ULx4 and right MLx2 on 04/23/2019 *left Pneumonia--completed treatment SECONDARY DIAGNOSIS:  History reviewed. No pertinent past medical history.  HOSPITAL COURSE:   Jose Andrews a48 y.o.malewithno significant past medical history presented to emergency room for cough for the last couple of days.Patient also had fever.Patient had excessive cough and had chest discomfort he came to the emergency room with short of breath he was evaluated chest x-ray showed pneumothorax  * Right sided Tension pneumothorax -Status post chest tube placement--now removed -Surgery consult with Dr Jose Andrews. -Ct chest with multiple bullae.  -S/p right thoracoscopy with giant bullectomy right upper lobe x4; right middle lobe x2 on 04/23/2019 -Pain medications as needed -Doing well. Andrews Dr Jose Andrews f/u next week  *Community-acquired pneumonia -IV cefepime changed to Augmentin.  pt finished antibiotics -Follow-up cultures negative so far  * Emphysema on CXR - PRN inhalers  * Tobacco abuse Tobacco cessation counseled earlier in admission  * DVT prophylaxis subcutaneous Lovenox daily  D/c today. Pt agrees  CONSULTS OBTAINED:  Treatment Team:  Jose Marin, MD  DRUG ALLERGIES:  No Known Allergies  DISCHARGE MEDICATIONS:   Allergies as of 04/29/2019   No Known Allergies     Medication List    TAKE these medications   acetaminophen 500 MG tablet Commonly known as: TYLENOL Take 500-1,000 mg by mouth every 6 (six) hours as needed  for mild pain, moderate pain or fever.   albuterol 108 (90 Base) MCG/ACT inhaler Commonly known as: VENTOLIN HFA Inhale 2 puffs into the lungs every 6 (six) hours as needed for wheezing or shortness of breath.   multivitamin with minerals Tabs tablet Take 1 tablet by mouth daily.   nicotine 21 mg/24hr patch Commonly known as: NICODERM CQ - dosed in mg/24 hours Place 1 patch (21 mg total) onto the skin daily. Start taking on: April 30, 2019   oxyCODONE-acetaminophen 5-325 MG tablet Commonly known as: PERCOCET/ROXICET Take 1 tablet by mouth every 8 (eight) hours as needed for severe pain.       If you experience worsening of your admission symptoms, develop shortness of breath, life threatening emergency, suicidal or homicidal thoughts you must seek medical attention immediately by calling 911 or calling your MD immediately  if symptoms less severe.  You Must read complete instructions/literature along with all the possible adverse reactions/side effects for all the Medicines you take and that have been prescribed to you. Take any new Medicines after you have completely understood and accept all the possible adverse reactions/side effects.   Please note  You were cared for by a hospitalist during your hospital stay. If you have any questions about your discharge medications or the care you received while you were in the hospital after you are discharged, you can call the unit and asked to speak with the hospitalist on call if the hospitalist that took care of you is not available. Once you are discharged, your primary care physician will handle any further medical issues. Please note that NO REFILLS for any discharge medications will be authorized once you are discharged,  as it is imperative that you return to your primary care physician (or establish a relationship with a primary care physician if you do not have one) for your aftercare needs so that they can reassess your need for  medications and monitor your lab values. Today   SUBJECTIVE   Doing well  VITAL SIGNS:  Blood pressure 112/77, pulse 71, temperature 98.7 F (37.1 C), temperature source Oral, resp. rate 20, height 6\' 2"  (1.88 m), weight 63.7 kg, SpO2 99 %.  I/O:    Intake/Output Summary (Last 24 hours) at 04/29/2019 1023 Last data filed at 04/29/2019 0130 Gross Andrews 24 hour  Intake 700 ml  Output -  Net 700 ml    PHYSICAL EXAMINATION:  GENERAL:  48 y.o.-year-old patient lying in the bed with no acute distress. Thin EYES: Pupils equal, round, reactive to light and accommodation. No scleral icterus. Extraocular muscles intact.  HEENT: Head atraumatic, normocephalic. Oropharynx and nasopharynx clear.  NECK:  Supple, no jugular venous distention. No thyroid enlargement, no tenderness.  LUNGS: Normal breath sounds bilaterally, no wheezing, rales,rhonchi or crepitation. No use of accessory muscles of respiration. Dressing + CARDIOVASCULAR: S1, S2 normal. No murmurs, rubs, or gallops.  ABDOMEN: Soft, non-tender, non-distended. Bowel sounds present. No organomegaly or mass.  EXTREMITIES: No pedal edema, cyanosis, or clubbing.  NEUROLOGIC: Cranial nerves II through XII are intact. Muscle strength 5/5 in all extremities. Sensation intact. Gait not checked.  PSYCHIATRIC: The patient is alert and oriented x 3.  SKIN: No obvious rash, lesion, or ulcer.   DATA REVIEW:   CBC  Recent Labs  Lab 04/24/19 0357  WBC 13.8*  HGB 11.6*  HCT 33.4*  PLT 390    Chemistries  Recent Labs  Lab 04/24/19 0357  NA 136  K 3.6  CL 100  CO2 27  GLUCOSE 139*  BUN 10  CREATININE 0.64  CALCIUM 8.8*    Microbiology Results   Recent Results (from the past 240 hour(s))  SARS CORONAVIRUS 2 (TAT 6-24 HRS)     Status: None   Collection Time: 04/20/19 12:05 PM  Result Value Ref Range Status   SARS Coronavirus 2 NEGATIVE NEGATIVE Final    Comment: (NOTE) SARS-CoV-2 target nucleic acids are NOT DETECTED. The  SARS-CoV-2 RNA is generally detectable in upper and lower respiratory specimens during the acute phase of infection. Negative results do not preclude SARS-CoV-2 infection, do not rule out co-infections with other pathogens, and should not be used as the sole basis for treatment or other patient management decisions. Negative results must be combined with clinical observations, patient history, and epidemiological information. The expected result is Negative. Fact Sheet for Patients: SugarRoll.be Fact Sheet for Healthcare Providers: https://www.woods-mathews.com/ This test is not yet approved or cleared by the [place] FDA and  has been authorized for detection and/or diagnosis of SARS-CoV-2 by FDA under an Emergency Use Authorization (EUA). This EUA will remain  in effect (meaning this test can be used) for the duration of the COVID-19 declaration under Section 56 4(b)(1) of the Act, 21 U.S.C. section 360bbb-3(b)(1), unless the authorization is terminated or revoked sooner. Performed at [facility], [facility] [address], [place], [place] [address]   Blood Culture (routine x 2)     Status: None   Collection Time: 04/20/19 12:58 PM   Specimen: BLOOD RIGHT ARM  Result Value Ref Range Status   Specimen Description BLOOD RIGHT ARM  Final   Special Requests   Final    BOTTLES DRAWN  AEROBIC AND ANAEROBIC Blood Culture adequate volume   Culture   Final    NO GROWTH 5 DAYS Performed at [facility], [address] [place], [place] [address]    Report Status 04/25/2019 FINAL  Final  Blood Culture (routine x 2)     Status: None   Collection Time: 04/20/19 12:58 PM   Specimen: BLOOD RIGHT ARM  Result Value Ref Range Status   Specimen Description BLOOD RIGHT ARM  Final   Special Requests   Final    BOTTLES DRAWN AEROBIC AND ANAEROBIC Blood Culture adequate volume   Culture   Final    NO GROWTH 5 DAYS Performed at [facility], [address], [place], [place] [address]    Report Status 04/25/2019 FINAL  Final  Urine culture     Status: None   Collection Time: 04/20/19 12:58 PM   Specimen: In/Out Cath Urine  Result Value Ref Range Status   Specimen Description   Final    IN/OUT CATH URINE Performed at [facility], [address], [place], [place] [address]    Special Requests   Final    NONE Performed at [facility], [address], [place], [place] [address]    Culture   Final    NO GROWTH Performed at [facility], [address] [place], [place] [address]    Report Status 04/22/2019 FINAL  Final  MRSA PCR Screening     Status: None   Collection Time: 04/21/19  9:22 AM   Specimen: Nasopharyngeal  Result Value Ref Range Status   MRSA by PCR NEGATIVE NEGATIVE Final    Comment:        The GeneXpert MRSA Assay (FDA approved for NASAL specimens only), is one component of a comprehensive MRSA colonization surveillance program. It is not intended to diagnose MRSA infection nor to guide or monitor treatment for MRSA infections. Performed at [facility], [address], [place], [place] [address]   Fungus Culture With Stain     Status: None (Preliminary result)   Collection Time: 04/23/19  3:11 PM   Specimen: Lung, Right; Tissue  Result Value Ref Range Status   Fungus Stain Final report  Final    Comment: (NOTE) Performed At: [facility] [address] [place], [place] [id number] Jolene Schimke MD BM:[id number]    Fungus (Mycology) Culture PENDING  Incomplete   Fungal Source WOUND  Final    Comment: Performed at [facility], [address] [place], [place] [address]  Aerobic/Anaerobic Culture (surgical/deep wound)     Status: None   Collection Time: 04/23/19  3:11 PM   Specimen: Lung, Right; Tissue  Result Value Ref Range Status   Specimen Description TISSUE RIGHT LUNG  Final   Special Requests MIDDLE LOBE  Final    Gram Stain NO WBC SEEN NO ORGANISMS SEEN   Final   Culture   Final    No growth aerobically or anaerobically. Performed at [facility], [address] [place], [place] [address]    Report Status 04/28/2019 FINAL  Final  Acid Fast Smear (AFB)     Status: None   Collection Time: 04/23/19  3:11 PM   Specimen: Lung, Right; Tissue  Result Value Ref Range Status   AFB Specimen Processing Comment  Final    Comment: Tissue Grinding and Digestion/Decontamination   Acid Fast Smear Negative  Final    Comment: (NOTE) Performed At: [facility] [address] [place], [place] [id number] Jolene Schimke MD HQ:[id number]    Source (AFB) WOUND  Final    Comment: Performed at [facility], [address] [place], [place] [address]  Fungus Culture Result     Status: None   Collection Time: 04/23/19  3:11 PM  Result Value Ref Range Status   Result 1 Comment  Final    Comment: (NOTE) KOH/Calcofluor preparation:  no fungus observed. Performed At: [facility] [address] [place], [place] [id number] Jolene Schimke MD UY:[id number]   Fungus Culture With Stain     Status: None   Collection Time: 04/23/19  3:20 PM   Specimen: Lung, Right; Tissue  Result Value Ref Range Status   Fungus Stain Final report  Final    Comment: (NOTE) Performed At: [facility] [address] [place], [place] [id number] Jolene Schimke MD IR:[id number]    Fungus (Mycology) Culture PENDING  Incomplete   Fungal Source LUNG  Corrected    Comment: Performed at [facility], [address] [place], [place] [address] CORRECTED ON 10/22 AT 1600: PREVIOUSLY REPORTED AS WOUND   Aerobic/Anaerobic Culture (surgical/deep wound)     Status: None   Collection Time: 04/23/19  3:20 PM   Specimen: Lung, Right; Tissue  Result Value Ref Range Status   Specimen Description   Final    TISSUE RIGHT UPPER LUNG Performed at [facility], [address], [place], [place]  [address]    Special Requests   Final    NONE Performed at [facility], [address] [place], [place] [address]    Gram Stain NO WBC SEEN NO ORGANISMS SEEN   Final   Culture   Final    No growth aerobically or anaerobically. Performed at [facility], [address] [place], [place] [address]    Report Status 04/28/2019 FINAL  Final  Acid Fast Smear (AFB)     Status: None   Collection Time: 04/23/19  3:20 PM   Specimen: Lung, Right; Tissue  Result Value Ref Range Status   AFB Specimen Processing Comment  Final    Comment: Tissue Grinding and Digestion/Decontamination   Acid Fast Smear Negative  Final    Comment: (NOTE) Performed At: [facility] [address] [place], [place] [id number] Jolene Schimke MD EG:[id number]    Source (AFB) LUNG  Corrected    Comment: Performed at [facility], [address] [place], [place] [address] CORRECTED ON 10/22 AT 1600: PREVIOUSLY REPORTED AS WOUND   Fungus Culture Result     Status: None   Collection Time: 04/23/19  3:20 PM  Result Value Ref Range Status   Result 1 Comment  Final    Comment: (NOTE) KOH/Calcofluor preparation:  no fungus observed. Performed At: [facility] [address] [place], [place] [id number] Jolene Schimke MD VO:[id number]     RADIOLOGY:  Dg Chest 2 View  Result Date: 04/29/2019 CLINICAL DATA:  Right-sided pneumothorax with chest tube removal. EXAM: CHEST - 2 VIEW COMPARISON:  04/27/2019 FINDINGS: Interval removal right-sided chest tube. No definite residual right-sided pneumothorax. Surgical sutures over the lateral right midlung. Remainder of the lungs are clear. Cardiomediastinal silhouette and remainder of the exam is unchanged. IMPRESSION: No acute cardiopulmonary disease. Interval removal right-sided chest tube. No residual right-sided pneumothorax. Surgical changes over the lateral right midlung. Electronically Signed   By: Elberta Fortis M.D.   On: 04/29/2019  07:54     CODE STATUS:  Code Status Orders  (From admission, onward)         Start     Ordered   04/20/19 1558  Full code  Continuous     04/20/19 1558        Code Status History    This patient has a current code status but no historical code status.   Advance Care Planning Activity      TOTAL TIME TAKING CARE OF THIS PATIENT: *40* minutes.    Enedina FinnerSona Harlynn Kimbell M.D on 04/29/2019 at 10:23 AM  Between 7am to 6pm - Pager - [phone] After 6pm go to [email or web address] - password [email or web address] [facility]  [facility]  Office  [phone]  CC: Primary care physician; Jose Andrews

## 2019-04-29 NOTE — Progress Notes (Signed)
  Patient ID: Jose Andrews, male   DOB: [birth year], 48 y.o.   MRN: [id number]  Did well overnight.  Not short of breath.  Pain is minimal.  CXRay from this morning looks good  Dressing clean and dry  OK for discharge.  Should followup with me in one week for suture removal

## 2019-05-07 ENCOUNTER — Encounter: Payer: Self-pay | Admitting: Physician Assistant

## 2019-05-07 ENCOUNTER — Other Ambulatory Visit: Payer: Self-pay

## 2019-05-07 ENCOUNTER — Ambulatory Visit (INDEPENDENT_AMBULATORY_CARE_PROVIDER_SITE_OTHER): Payer: Self-pay | Admitting: Physician Assistant

## 2019-05-07 VITALS — BP 110/83 | HR 86 | Temp 98.1°F | Resp 12 | Ht 72.0 in | Wt 149.6 lb

## 2019-05-07 DIAGNOSIS — Z9889 Other specified postprocedural states: Secondary | ICD-10-CM

## 2019-05-07 DIAGNOSIS — Z09 Encounter for follow-up examination after completed treatment for conditions other than malignant neoplasm: Secondary | ICD-10-CM

## 2019-05-07 MED ORDER — METHOCARBAMOL 500 MG PO TABS: 500.0000 mg | ORAL_TABLET | Freq: Four times a day (QID) | ORAL | 0 refills | Status: DC | PRN

## 2019-05-07 MED ORDER — OXYCODONE HCL 5 MG PO TABS: 5.0000 mg | ORAL_TABLET | Freq: Four times a day (QID) | ORAL | 0 refills | Status: DC | PRN

## 2019-05-07 NOTE — Progress Notes (Signed)
[facility] POST-OP OFFICE VISIT  05/07/2019  HPI: Jose Andrews is a 48 y.o. male 14 days s/p right thoracotomy and bullectomy x6 with Dr Genevive Bi.  Today, he reports that he is ding alright. He still is having pain in his lateral chest wall particularly with movement and palpation. He has tried oxycodone which provides minimal relief. Has not tried supplementing anything else for pain. No fever, chills, nausea. He is using his IS at home. He is gradually increasing activity. He has stopped smoking. Using nicotine patches. No other complaints.   Vital signs: BP 110/83   Pulse 86   Temp 98.1 F (36.7 C) (Temporal)   Resp 12   Ht 6' (1.829 m)   Wt 149 lb 9.6 oz (67.9 kg)   SpO2 98%   BMI 20.29 kg/m    Physical Exam: Constitutional: Well appearing male, tall and thin, NAD Lungs: Coarse breath sounds throughout all fields bilaterally Skin: Chest tube and VATs incision sites are CDI with sutures (removed), no erythema or drainage.   Assessment/Plan: This is a 48 y.o. male 14 days s/p right thoracotomy and bullectomy   - pain control; try supplementing tylenol and ibuprofen  - Will refill oxycodone and try Robaxin as I suspect there is a significant MSK component of his pain  - continue pulmonary toilet; IS use  - Reinforced smoking cessation  - rtc on 11/09 - with Dr Genevive Bi w/ CXR prior  -- Edison Simon, PA-C [facility] 05/07/2019, 2:41 PM [phone] M-F: 7am - 4pm '

## 2019-05-07 NOTE — Patient Instructions (Addendum)
Please have the chest xray prior to seeing Dr.Oaks. Please see your follow up appointment listed below.   Please take your prescription to the pharmacy today to be filled.

## 2019-05-11 ENCOUNTER — Ambulatory Visit (INDEPENDENT_AMBULATORY_CARE_PROVIDER_SITE_OTHER): Payer: Self-pay | Admitting: Cardiothoracic Surgery

## 2019-05-11 ENCOUNTER — Other Ambulatory Visit: Payer: Self-pay

## 2019-05-11 ENCOUNTER — Ambulatory Visit
Admission: RE | Admit: 2019-05-11 | Discharge: 2019-05-11 | Disposition: A | Discharge status: Home / Self Care | Payer: Self-pay | Source: Ambulatory Visit | Attending: Cardiothoracic Surgery | Admitting: Cardiothoracic Surgery

## 2019-05-11 ENCOUNTER — Ambulatory Visit
Admission: RE | Admit: 2019-05-11 | Discharge: 2019-05-11 | Disposition: A | Discharge status: Home / Self Care | Payer: Self-pay | Attending: Cardiothoracic Surgery | Admitting: Cardiothoracic Surgery

## 2019-05-11 ENCOUNTER — Encounter: Payer: Self-pay | Admitting: Cardiothoracic Surgery

## 2019-05-11 ENCOUNTER — Telehealth: Payer: Self-pay

## 2019-05-11 VITALS — BP 126/83 | HR 87 | Temp 98.2°F | Ht 75.0 in | Wt 152.0 lb

## 2019-05-11 DIAGNOSIS — Z9889 Other specified postprocedural states: Secondary | ICD-10-CM

## 2019-05-11 DIAGNOSIS — J939 Pneumothorax, unspecified: Secondary | ICD-10-CM

## 2019-05-11 NOTE — Telephone Encounter (Signed)
Spoke with the patient about a referral to pulmonary. As he does not have insurance at this time Dr Genevive Bi said that he should see his primary care provider for his pulmonary care. They may have additional resources to get him seen by pulmonary with out insurance. He is fine with this. He is scheduled to see Crissie Figures PA-C at [facility] on 05/19/19 at 8:40 am.

## 2019-05-11 NOTE — Progress Notes (Signed)
This patient returns today in follow-up.  He is done very well.  He was seen last week by our physician assistant Mr. Loree Fee.  His only complaint is that he has occasional twinges of pain in the lower aspect of his right hemithorax when he makes sharp movements.  He did have a chest x-ray made today.  His lungs are diminished bilaterally but equal and clear.  His heart is regular.  His incisions are all healing as expected.  There is no erythema, drainage or discharge.  I have independently reviewed his chest x-ray and there is no evidence of pneumothorax or pleural effusion.  I would like to see him back again in 2 weeks and I will refer him onto our pulmonary medicine specialist for their evaluation.  All of his questions were answered.

## 2019-05-11 NOTE — Patient Instructions (Addendum)
Continue to use your incentive spirometer. May take Ibuprofen 600 mg every 6 hours in addition to your Oxycodone and Robaxin.   We will refer you to pulmonary. They will call you to schedule this appointment.   Follow up here in two weeks with a Chest Xray prior to your appointment.

## 2019-05-22 ENCOUNTER — Other Ambulatory Visit: Payer: Self-pay | Admitting: Cardiothoracic Surgery

## 2019-05-22 ENCOUNTER — Encounter: Payer: Self-pay | Admitting: Cardiothoracic Surgery

## 2019-05-22 ENCOUNTER — Ambulatory Visit
Admission: RE | Admit: 2019-05-22 | Discharge: 2019-05-22 | Disposition: A | Discharge status: Home / Self Care | Payer: Self-pay | Source: Ambulatory Visit | Attending: Cardiothoracic Surgery | Admitting: Cardiothoracic Surgery

## 2019-05-22 ENCOUNTER — Other Ambulatory Visit: Payer: Self-pay

## 2019-05-22 ENCOUNTER — Ambulatory Visit
Admission: RE | Admit: 2019-05-22 | Discharge: 2019-05-22 | Disposition: A | Discharge status: Home / Self Care | Payer: Self-pay | Attending: Cardiothoracic Surgery | Admitting: Cardiothoracic Surgery

## 2019-05-22 ENCOUNTER — Ambulatory Visit (INDEPENDENT_AMBULATORY_CARE_PROVIDER_SITE_OTHER): Payer: Self-pay | Admitting: Cardiothoracic Surgery

## 2019-05-22 VITALS — BP 111/75 | HR 90 | Temp 98.1°F | Resp 14 | Ht 74.0 in | Wt 158.0 lb

## 2019-05-22 DIAGNOSIS — J939 Pneumothorax, unspecified: Secondary | ICD-10-CM

## 2019-05-22 MED ORDER — TRAMADOL HCL 50 MG PO TABS: 50.0000 mg | ORAL_TABLET | Freq: Four times a day (QID) | ORAL | 0 refills | Status: DC | PRN

## 2019-05-22 NOTE — Progress Notes (Signed)
  He returns today in follow-up.  He has no new complaints.  He states that his pain is still present.  He does not smoke.  He did see his primary care physician and they are awaiting referral to pulmonary medicine.  His lungs are clear.  His heart is regular.  His thoracoscopy wounds are well-healed.  His chest x-ray shows no evidence of pneumothorax or pleural effusion  We will prescribe him some tramadol for postoperative pain.  He can go back to work as needed.  A return visit was not made but I would be happy to see him should the need arise.

## 2019-05-26 LAB — FUNGUS CULTURE WITH STAIN

## 2019-05-26 LAB — FUNGUS CULTURE RESULT

## 2019-05-26 LAB — FUNGAL ORGANISM REFLEX

## 2019-05-27 ENCOUNTER — Other Ambulatory Visit: Payer: Self-pay

## 2019-05-27 DIAGNOSIS — Z9889 Other specified postprocedural states: Secondary | ICD-10-CM

## 2019-05-27 DIAGNOSIS — J939 Pneumothorax, unspecified: Secondary | ICD-10-CM

## 2019-06-07 LAB — ACID FAST CULTURE WITH REFLEXED SENSITIVITIES (MYCOBACTERIA)
Acid Fast Culture: NEGATIVE
Acid Fast Culture: NEGATIVE

## 2019-07-02 ENCOUNTER — Encounter: Payer: Self-pay | Admitting: *Deleted

## 2019-07-14 ENCOUNTER — Ambulatory Visit (INDEPENDENT_AMBULATORY_CARE_PROVIDER_SITE_OTHER): Payer: Self-pay | Admitting: Pulmonary Disease

## 2019-07-14 ENCOUNTER — Encounter: Payer: Self-pay | Admitting: Pulmonary Disease

## 2019-07-14 ENCOUNTER — Other Ambulatory Visit: Payer: Self-pay

## 2019-07-14 VITALS — BP 142/80 | HR 81 | Temp 98.1°F | Ht 74.0 in | Wt 162.6 lb

## 2019-07-14 DIAGNOSIS — J939 Pneumothorax, unspecified: Secondary | ICD-10-CM

## 2019-07-14 DIAGNOSIS — Z87891 Personal history of nicotine dependence: Secondary | ICD-10-CM

## 2019-07-14 DIAGNOSIS — J439 Emphysema, unspecified: Secondary | ICD-10-CM

## 2019-07-14 NOTE — Progress Notes (Signed)
Subjective:    Patient ID: Jose Andrews, male    DOB: 06/22/1971, 49 y.o.   MRN: [id number]  HPI The patient is a 49 year old recent former smoker, from [place] for evaluation of bullous lung disease.  Kindly referred by Gorden Harms, PA-C.  Patient had been admitted to [facility] on 20 April 2019 with a complaint of cough for a few days prior to admission.  He also noted fever.  During an episode of cough he developed significant chest discomfort and presented to the emergency room.  He was noted to have significant shortness of breath and chest x-ray showed a large tension pneumothorax on the right with tracheal deviation to the left.  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  A total of 4 bullae resected on the right upper lobe and 2 on the right middle lobe.  Surgery was performed by Dr. Thelma Barge.  The patient has done well since then.  When no further issues with shortness of breath.  He has some residual pleuritic chest pain postoperatively on the right.  The patient does not endorse any fevers, chills or sweats.  He has had some cough productive of brownish sputum mostly in the mornings.  He used to smoke half to 1 pack of cigarettes per day his age 49.  He quit this 3 days ago.  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   Review of Systems A 10 point review of systems was performed and it is as noted above otherwise negative.  Past Medical History:  Diagnosis Date  . Emphysema lung (HCC) 04/2019  . Pneumothorax on right 04/2019   Past Surgical History:  Procedure Laterality Date  . none    . THORACOTOMY Right 04/23/2019   Procedure: POSSIBLE THORACOTOMY MAJOR;  Surgeon: Hulda Marin, MD;  Location: [facility] ORS;  Service: General;  Laterality: Right;  Marland Kitchen VIDEO ASSISTED THORACOSCOPY (VATS)/THOROCOTOMY Right 04/23/2019   Procedure: VIDEO ASSISTED  THORACOSCOPY (VATS);  Surgeon: Hulda Marin, MD;  Location: [facility] ORS;  Service: General;  Laterality: Right;   History reviewed. No pertinent family history.  Social history: As noted above  No Known Allergies  Current Meds  Medication Sig  . acetaminophen (TYLENOL) 500 MG tablet Take 500-1,000 mg by mouth every 6 (six) hours as needed for mild pain, moderate pain or fever.  Marland Kitchen albuterol (VENTOLIN HFA) 108 (90 Base) MCG/ACT inhaler Inhale 2 puffs into the lungs every 6 (six) hours as needed for wheezing or shortness of breath.  . Multiple Vitamin (MULTIVITAMIN WITH MINERALS) TABS tablet Take 1 tablet by mouth daily.  . nicotine (NICODERM CQ - DOSED IN MG/24 HOURS) 21 mg/24hr patch Place 1 patch (21 mg total) onto the skin daily.       Objective:   Physical Exam BP (!) 142/80 (BP Location: Left Arm, Cuff Size: Large)   Pulse 81   Temp 98.1 F (36.7 C) (Temporal)   Ht 6\' 2"  (1.88 m)   Wt 162 lb 9.6 oz (73.8 kg)   SpO2 99%   BMI 20.88 kg/m   GENERAL: Thin, muscular male, no acute distress.  Fully ambulatory. HEAD: Normocephalic, atraumatic.  EYES: Pupils equal, round, reactive to light.  No scleral icterus.  MOUTH: Nose/mouth/throat not examined due to masking requirements for COVID 19. NECK: Supple. No thyromegaly. Trachea midline. No JVD.  No adenopathy. PULMONARY: Good air entry bilaterally, coarse breath sounds, no  other adventitious sounds. CARDIOVASCULAR: S1 and S2. Regular rate and rhythm.  No rubs murmurs gallops heard. GASTROINTESTINAL: Benign. MUSCULOSKELETAL: No joint deformity, no clubbing, no edema.  NEUROLOGIC: Awake, alert, no focal deficits noted.  Speech is fluent. SKIN: Intact,warm,dry.  Prior thoracoscopic wounds well-healed. PSYCH: Mood and behavior are appropriate.  Postoperative chest x-ray: Independently reviewed, no pneumothorax.  Hyperinflation and emphysema noted.       Assessment & Plan:     ICD-10-CM   1. Pulmonary emphysema, unspecified  emphysema type (HCC)  J43.9    Check alpha-1 PFTs once able (COVID-19 restrictions)  2. Pneumothorax on right  J93.9    Status post thoracoscopic bullectomy, multiple, on right  3. Former moderate cigarette smoker (10-19 per day)  Z87.891    Discussion: Patient had secondary pneumothorax due to significant bullous disease, he is status post bullectomies on the right.  This is definitive treatment for this issue.  Suspect he will not have issues with recurrent pneumothorax on the right.  Will obtain alpha-1 antitrypsin to evaluate his pulmonary emphysema further.  He should have ideally, PFTs however, these are being withheld due to COVID-19 restrictions.  Once these restrictions lifted we will order these.  Should in 3 months time he is to contact us prior to that time should any new difficulties arise.   Renold Don, MD  PCCM    *This note was dictated using voice recognition software/Dragon.  Despite best efforts to proofread, errors can occur which can change the meaning.  Any change was purely unintentional.

## 2019-07-14 NOTE — Patient Instructions (Signed)
1.  We will call you the results of the test we did today.  This may take several weeks.  2.  We will see you in follow-up in 3 months time please call sooner should you have any problems with your breathing prior to that.

## 2019-08-10 ENCOUNTER — Telehealth: Payer: Self-pay

## 2019-08-10 NOTE — Telephone Encounter (Signed)
LVMTCBx1.   Alpha 1 test results are normal.

## 2019-08-11 NOTE — Telephone Encounter (Signed)
LVMTCBx2. 

## 2019-08-12 NOTE — Telephone Encounter (Addendum)
Lm x3 for pt.  Letter has been mailed to address on file.  Will close encounter.

## 2019-11-16 ENCOUNTER — Other Ambulatory Visit
Admission: RE | Admit: 2019-11-16 | Discharge: 2019-11-16 | Disposition: A | Discharge status: Home / Self Care | Payer: Self-pay | Source: Ambulatory Visit | Attending: Pulmonary Disease | Admitting: Pulmonary Disease

## 2019-11-16 ENCOUNTER — Encounter: Payer: Self-pay | Admitting: Pulmonary Disease

## 2019-11-16 ENCOUNTER — Other Ambulatory Visit: Payer: Self-pay

## 2019-11-16 ENCOUNTER — Ambulatory Visit (INDEPENDENT_AMBULATORY_CARE_PROVIDER_SITE_OTHER): Payer: Self-pay | Admitting: Pulmonary Disease

## 2019-11-16 VITALS — BP 122/76 | HR 94 | Temp 99.0°F | Ht 74.0 in | Wt 153.4 lb

## 2019-11-16 DIAGNOSIS — J301 Allergic rhinitis due to pollen: Secondary | ICD-10-CM

## 2019-11-16 DIAGNOSIS — Z87891 Personal history of nicotine dependence: Secondary | ICD-10-CM

## 2019-11-16 DIAGNOSIS — R0602 Shortness of breath: Secondary | ICD-10-CM | POA: Insufficient documentation

## 2019-11-16 DIAGNOSIS — J939 Pneumothorax, unspecified: Secondary | ICD-10-CM

## 2019-11-16 DIAGNOSIS — J439 Emphysema, unspecified: Secondary | ICD-10-CM

## 2019-11-16 MED ORDER — FLUTICASONE PROPIONATE 50 MCG/ACT NA SUSP: 1.0000 | Freq: Two times a day (BID) | NASAL | 2 refills | Status: DC

## 2019-11-16 MED ORDER — MONTELUKAST SODIUM 10 MG PO TABS: 10.0000 mg | ORAL_TABLET | Freq: Every day | ORAL | 2 refills | Status: DC

## 2019-11-16 NOTE — Patient Instructions (Signed)
Are going to do allergy test today.  We will schedule you for breathing test.   I am ordering nasal spray and a tablet for your symptoms of nasal congestion.   Follow-up in 2 months time call sooner should any new difficulties arise.

## 2019-11-16 NOTE — Progress Notes (Signed)
Subjective:    Patient ID: Jose Andrews, male    DOB: 04/21/1971, 49 y.o.   MRN: [id number]  HPI The patient is a 49 year old recent former smoker, from [place], follows here for bullous lung disease.  The patient had been admitted to [facility] on 20 April 2019 with a complaint of cough for a few days prior to admission.  He also noted fever.  During an episode of cough he developed significant chest discomfort and presented to the emergency room.  He was noted to have significant shortness of breath and chest x-ray showed a large tension pneumothorax on the right with tracheal deviation to the left.  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  A total of 4 bullae resected on the right upper lobe and 2 on the right middle lobe.  Surgery was performed by Dr. Thelma Barge.  The patient has done well since then.  He had an alpha-1 antitrypsin determination performed on 14 July 2019 which was normal.  He has not had PFTs due to COVID-19 restrictions.  These have been lifted now and we will schedule.  He states that he remains abstinent from smoking.  Today his only complaint is that of nasal congestion which causes him to have significant dyspnea.  When his nose is not congested he has no dyspnea.  He is coughing up clear mucus.  He has not had any hemoptysis.  No chest pain.  No orthopnea, paroxysmal nocturnal dyspnea, lower extremity edema or calf tenderness.  No fevers, chills or sweats.  He has noted the above symptoms with the change in weather.  Review of Systems A 10 point review of systems was performed and it is as noted above otherwise negative.    Immunization History  Administered Date(s) Administered  . PFIZER SARS-COV-2 Vaccination 10/07/2019  . Pneumococcal Conjugate-13 04/29/2019   Currently on no meds except for as needed albuterol.  No Known Allergies    Objective:   Physical Exam BP 122/76 (BP  Location: Left Arm, Cuff Size: Normal)   Pulse 94   Temp 99 F (37.2 C) (Temporal)   Ht 6\' 2"  (1.88 m)   Wt 153 lb 6.4 oz (69.6 kg)   SpO2 98%   BMI 19.70 kg/m   GENERAL: Thin, muscular male, no acute distress.  Fully ambulatory. HEAD: Normocephalic, atraumatic.  EYES: Pupils equal, round, reactive to light.  No scleral icterus.  MOUTH: There is bogginess of the nasal mucosa, almost complete obliteration of the nasal passages due to nasal mucosal edema.  Pharynx is clear. NECK: Supple. No thyromegaly. Trachea midline. No JVD.  No adenopathy. PULMONARY: Good air entry bilaterally, coarse breath sounds, no other adventitious sounds. CARDIOVASCULAR: S1 and S2. Regular rate and rhythm.  No rubs murmurs gallops heard. GASTROINTESTINAL: Benign. MUSCULOSKELETAL: No joint deformity, no clubbing, no edema.  NEUROLOGIC: Awake, alert, no focal deficits noted.  Speech is fluent. SKIN: Intact,warm,dry.  Limited exam no rashes. PSYCH: Mood and behavior are appropriate.      Assessment & Plan:     ICD-10-CM   1. Seasonal allergic rhinitis due to pollen  J30.1 Pulmonary Function Test [facility] Only    Allergen Panel (27) + IGE  2. SOB (shortness of breath)  R06.02 Pulmonary Function Test [facility] Only    Allergen Panel (27) + IGE   Due to upper airway resistance from nasal congestion  3. Pulmonary emphysema, unspecified emphysema type (HCC)  J43.9  4. Pneumothorax on right  J93.9   5. Former moderate cigarette smoker (10-19 per day)  Z87.891    Meds ordered this encounter  Medications  . montelukast (SINGULAIR) 10 MG tablet    Sig: Take 1 tablet (10 mg total) by mouth at bedtime.    Dispense:  30 tablet    Refill:  2  . fluticasone (FLONASE) 50 MCG/ACT nasal spray    Sig: Place 1 spray into both nostrils 2 (two) times daily.    Dispense:  18.2 mL    Refill:  2   Discussion: Patient appears to have issues with seasonal allergic rhinitis likely due to pollen.  This in turn is causing  significant upper airway resistance due to nasal congestion.  It appears that he does not have issues with shortness of breath unless  nasal congestion is present.  We will give him a trial of Flonase 1 inhalation twice a day to each nostril and Singulair 1 tablet daily.  The patient does have significant bullous emphysema he is status post a secondary pneumothorax on the right previously that required thoracoscopy.  Recommend PFTs as he has not been able to get these done previously due to COVID-19 restrictions.  He is alpha-1 negative.  We will see the patient in follow-up he is to contact us prior to that time should any new difficulties arise.

## 2019-11-19 LAB — ALLERGEN PANEL (27) + IGE
Alternaria Alternata IgE: <0.1 kU/L
Aspergillus Fumigatus IgE: <0.1 kU/L
Bahia Grass IgE: <0.1 kU/L
Bermuda Grass IgE: <0.1 kU/L
Cat Dander IgE: <0.1 kU/L
Cedar, Mountain IgE: <0.1 kU/L
Cladosporium Herbarum IgE: <0.1 kU/L
Cocklebur IgE: <0.1 kU/L
Cockroach, American IgE: <0.1 kU/L
Common Silver Birch IgE: <0.1 kU/L
D Farinae IgE: <0.1 kU/L
D Pteronyssinus IgE: <0.1 kU/L
Dog Dander IgE: <0.1 kU/L
Elm, American IgE: <0.1 kU/L
Hickory, White IgE: <0.1 kU/L
IgE (Immunoglobulin E), Serum: 73 IU/mL (ref 6–495)
Johnson Grass IgE: <0.1 kU/L
Kentucky Bluegrass IgE: <0.1 kU/L
Maple/Box Elder IgE: <0.1 kU/L
Mucor Racemosus IgE: <0.1 kU/L
Oak, White IgE: <0.1 kU/L
Penicillium Chrysogen IgE: <0.1 kU/L
Pigweed, Rough IgE: <0.1 kU/L
Plantain, English IgE: <0.1 kU/L
Ragweed, Short IgE: <0.1 kU/L
Setomelanomma Rostrat: <0.1 kU/L
Timothy Grass IgE: <0.1 kU/L
White Mulberry IgE: <0.1 kU/L

## 2019-11-27 ENCOUNTER — Other Ambulatory Visit: Payer: Self-pay

## 2019-11-27 ENCOUNTER — Other Ambulatory Visit
Admission: RE | Admit: 2019-11-27 | Discharge: 2019-11-27 | Disposition: A | Discharge status: Home / Self Care | Payer: 59 | Source: Ambulatory Visit | Attending: Pulmonary Disease | Admitting: Pulmonary Disease

## 2019-11-27 DIAGNOSIS — Z20822 Contact with and (suspected) exposure to covid-19: Secondary | ICD-10-CM | POA: Diagnosis not present

## 2019-11-27 DIAGNOSIS — Z01812 Encounter for preprocedural laboratory examination: Secondary | ICD-10-CM | POA: Insufficient documentation

## 2019-11-27 LAB — SARS CORONAVIRUS 2 (TAT 6-24 HRS): SARS Coronavirus 2: NEGATIVE

## 2019-11-30 ENCOUNTER — Ambulatory Visit: Payer: Self-pay | Attending: Pulmonary Disease

## 2019-11-30 ENCOUNTER — Other Ambulatory Visit: Payer: Self-pay

## 2019-11-30 DIAGNOSIS — J301 Allergic rhinitis due to pollen: Secondary | ICD-10-CM | POA: Insufficient documentation

## 2019-11-30 DIAGNOSIS — R0602 Shortness of breath: Secondary | ICD-10-CM | POA: Insufficient documentation

## 2019-11-30 MED ORDER — ALBUTEROL SULFATE (2.5 MG/3ML) 0.083% IN NEBU
2.5000 mg | INHALATION_SOLUTION | Freq: Once | RESPIRATORY_TRACT | Status: AC
  Administered 2019-11-30: 2.5 mg via RESPIRATORY_TRACT
  Filled 2019-11-30: qty 3

## 2020-01-18 ENCOUNTER — Ambulatory Visit: Payer: Self-pay | Admitting: Pulmonary Disease

## 2020-01-18 ENCOUNTER — Telehealth: Payer: Self-pay

## 2020-01-18 NOTE — Telephone Encounter (Signed)
Pt showed up >1hr late for his 11:30 appointment. We were unable to proceed with his visit, as Dr. Jayme Cloud had a procedure at 1:00 that she had to attend.  Pt was made aware that his appointment would need to be rescheduled. Pt was given the option to come back for a visit today at 3:30 and he declined.  Pt stated that he had some issues that he would like to discuss with Dr. Jayme Cloud. I did not go into detail with pt, due to being in the lobby and other patients being present.  I made pt aware that I would contact him after lunch to discuss his symptoms further and reschedule visit.  Lm for pt to return our call.

## 2020-01-19 NOTE — Telephone Encounter (Signed)
Spoke to pt, who reports of swelling of both nostrils, chest congestion, prod cough with white mucus and vomiting every morning. He also mentioned that he is unable to lay on his left or right side, due to pain near his belly button.  Pt has been scheduled for OV on 03/21/20.  Dr. Irish Lack, please advise.

## 2020-01-19 NOTE — Telephone Encounter (Signed)
Spoke to pt and relayed below message/recommendations.  Pt stated that he would contact PCP and discuss ENT referral with her.  Nothing further is needed at this time.

## 2020-01-19 NOTE — Telephone Encounter (Signed)
He needs to be evaluated by primary care and subsequently by ENT.  He presented more than an hour late for his appointment and had to be rescheduled.  If symptoms worsen he may need to be evaluated urgent care or [facility] in the emergency room.

## 2020-03-21 ENCOUNTER — Other Ambulatory Visit: Payer: Self-pay

## 2020-03-21 ENCOUNTER — Encounter: Payer: Self-pay | Admitting: Pulmonary Disease

## 2020-03-21 ENCOUNTER — Ambulatory Visit (INDEPENDENT_AMBULATORY_CARE_PROVIDER_SITE_OTHER): Payer: 59 | Admitting: Pulmonary Disease

## 2020-03-21 VITALS — BP 128/78 | HR 100 | Temp 99.6°F | Ht 74.0 in | Wt 142.2 lb

## 2020-03-21 DIAGNOSIS — J449 Chronic obstructive pulmonary disease, unspecified: Secondary | ICD-10-CM

## 2020-03-21 DIAGNOSIS — F17290 Nicotine dependence, other tobacco product, uncomplicated: Secondary | ICD-10-CM

## 2020-03-21 DIAGNOSIS — R0981 Nasal congestion: Secondary | ICD-10-CM

## 2020-03-21 DIAGNOSIS — J310 Chronic rhinitis: Secondary | ICD-10-CM

## 2020-03-21 MED ORDER — MONTELUKAST SODIUM 10 MG PO TABS: 10.0000 mg | ORAL_TABLET | Freq: Every day | ORAL | 2 refills | Status: AC

## 2020-03-21 MED ORDER — ANORO ELLIPTA 62.5-25 MCG/INH IN AEPB: 1.0000 | INHALATION_SPRAY | Freq: Every day | RESPIRATORY_TRACT | 0 refills | Status: AC

## 2020-03-21 MED ORDER — FLUTICASONE PROPIONATE 50 MCG/ACT NA SUSP: 1.0000 | Freq: Two times a day (BID) | NASAL | 2 refills | Status: DC

## 2020-03-21 NOTE — Patient Instructions (Addendum)
Going to renew your nasal spray 1 spray twice a day to each nostril  We also renewed the tablet that you take at bedtime for your nasal congestion   We are sending in a prescription for an inhaler to use once a day which should help with your breathing and your chest pain   We are referring you to ear nose and throat specialist   See you in follow-up in 4 months time call sooner should any new problems arise

## 2020-03-21 NOTE — Progress Notes (Signed)
Subjective:    Patient ID: Jose Andrews, male    DOB: February 05, 1971, 49 y.o.   MRN: [id number]  HPI Jose Andrews is a 49 year old former smoker, from [place], who presents for the issue of bullous emphysema.  Recall he had an admission in October 2020 for tension pneumothorax due to very large bulla in the patient required bullectomy at that time.  This was performed by Dr. Thelma Barge.  The patient has done well since then with the exception of having some persistent chest pain after the VATS surgery.  The pain is not limiting however.  The patient was last seen here in May 2021.  PFTs were performed on 30 Nov 2019.  The results were consistent with emphysema with preserved FEV1.  Patient's main complaint on this and previous visits is that of nasal congestion that is chronic . Previously he had been on Flonase and Singulair with some good response.  He has run out of these medications.  He has some mild shortness of breath on exertion.  He smokes cigars.  He quit smoking cigarettes.  He also smokes marijuana from time to time.  He has not had any recent fevers, chills or sweats and voices no other complaint.  The patient states that he has been using some sort of nasal spray over-the-counter but cannot tell me what it is.  It does not appear to be Afrin per his description query if it is nasal saline.  He states he does not use cocaine or inhale any drugs.   Review of Systems A 10 point review of systems was performed and it is as noted above otherwise negative.  No Known Allergies  Current Meds  Medication Sig  . acetaminophen (TYLENOL) 500 MG tablet Take 500-1,000 mg by mouth every 6 (six) hours as needed for mild pain, moderate pain or fever.  Marland Kitchen albuterol (VENTOLIN HFA) 108 (90 Base) MCG/ACT inhaler Inhale 2 puffs into the lungs every 6 (six) hours as needed for wheezing or shortness of breath.  . montelukast (SINGULAIR) 10 MG tablet Take 1 tablet (10 mg total) by mouth at bedtime.  . nicotine  (NICODERM CQ - DOSED IN MG/24 HOURS) 21 mg/24hr patch Place 1 patch (21 mg total) onto the skin daily.  . [DISCONTINUED] montelukast (SINGULAIR) 10 MG tablet Take 1 tablet (10 mg total) by mouth at bedtime.   Patient states ran out of Singulair and Flonase  Immunization History  Administered Date(s) Administered  . PFIZER SARS-COV-2 Vaccination 10/07/2019  . Pneumococcal Conjugate-13 04/29/2019  . Pneumococcal Polysaccharide-23 02/18/2017  . Tdap 02/18/2017  Did not take second dose of Pfizer vaccine because first dose made his arm hurt  Social History   Tobacco Use  . Smoking status: Current Every Day Smoker    Types: Cigars  . Smokeless tobacco: Never Used  . Tobacco comment: 1-3 cigars a day  Substance Use Topics  . Alcohol use: Never   Former cigarette smoker.  Does smoke marijuana on occasion.     Objective:   Physical Exam BP 128/78 (BP Location: Left Arm, Cuff Size: Normal)   Pulse 100   Temp 99.6 F (37.6 C) (Temporal)   Ht 6\' 2"  (1.88 m)   Wt 142 lb 3.2 oz (64.5 kg)   SpO2 99%   BMI 18.26 kg/m  GENERAL: Thin, muscular male, no acute distress.  Fully ambulatory. HEAD: Normocephalic, atraumatic.  EYES: Pupils equal, round, reactive to light.  No scleral icterus.  MOUTH: Limited examination of the cavity today  shows severe turbinate edema with almost complete obliteration of the nasal passages due to the edema. NECK: Supple. No thyromegaly. Trachea midline. No JVD.  No adenopathy. PULMONARY: Good air entry bilaterally, coarse breath sounds, no other adventitious sounds. CARDIOVASCULAR: S1 and S2. Regular rate and rhythm.  No rubs murmurs gallops heard. GASTROINTESTINAL: Benign. MUSCULOSKELETAL: No joint deformity, no clubbing, no edema.  NEUROLOGIC: Awake, alert, no focal deficits noted.  Speech is fluent. SKIN: Intact,warm,dry.   PSYCH: Mood and behavior are appropriate.    Assessment & Plan:     ICD-10-CM   1. Chronic nasal congestion  R09.81 Ambulatory  referral to ENT   Has chronic turbinate edema, referral to ENT Continue Flonase and Singulair Avoid Afrin or similar nasal sprays  2. 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 Cigar smoker  F17.290    Encouraged to discontinue habit   Meds ordered this encounter  Medications  . umeclidinium-vilanterol (ANORO ELLIPTA) 62.5-25 MCG/INH AEPB    Sig: Inhale 1 puff into the lungs daily for 1 day.    Dispense:  7 each    Refill:  0    Order Specific Question:   Lot Number?    Answer:   [id number]    Order Specific Question:   Expiration Date?    Answer:   03/02/2021    Order Specific Question:   Manufacturer?    Answer:   [employer] [[id number]]    Order Specific Question:   Quantity    Answer:   2  . fluticasone (FLONASE) 50 MCG/ACT nasal spray    Sig: Place 1 spray into both nostrils 2 (two) times daily.    Dispense:  18.2 mL    Refill:  2  . montelukast (SINGULAIR) 10 MG tablet    Sig: Take 1 tablet (10 mg total) by mouth at bedtime.    Dispense:  30 tablet    Refill:  2   Orders Placed This Encounter  Procedures  . Ambulatory referral to ENT    Referral Priority:   Routine    Referral Type:   Consultation    Referral Reason:   Specialty Services Required    Requested Specialty:   Otolaryngology    Number of Visits Requested:   1   Discussion:  From the pulmonary standpoint patient is doing well.  His complaints seem to be more ENT related.  Will refer to ENT.  We will see the patient in follow-up in 2 to contact us prior to that time should any new difficulties arise.   Gailen Shelter, MD [facility]   *This note was dictated using voice recognition software/Dragon.  Despite best efforts to proofread, errors can occur which can change the meaning.  Any change was purely unintentional.

## 2020-04-14 ENCOUNTER — Other Ambulatory Visit: Payer: Self-pay | Admitting: Pulmonary Disease

## 2020-10-10 IMAGING — DX DG CHEST 1V PORT
1 series · 1 of 1 positions shown · non-contrast
Comparison: Radiograph yesterday.

CLINICAL DATA: Pneumothorax on right.

EXAM:
PORTABLE CHEST 1 VIEW

[chest ap]
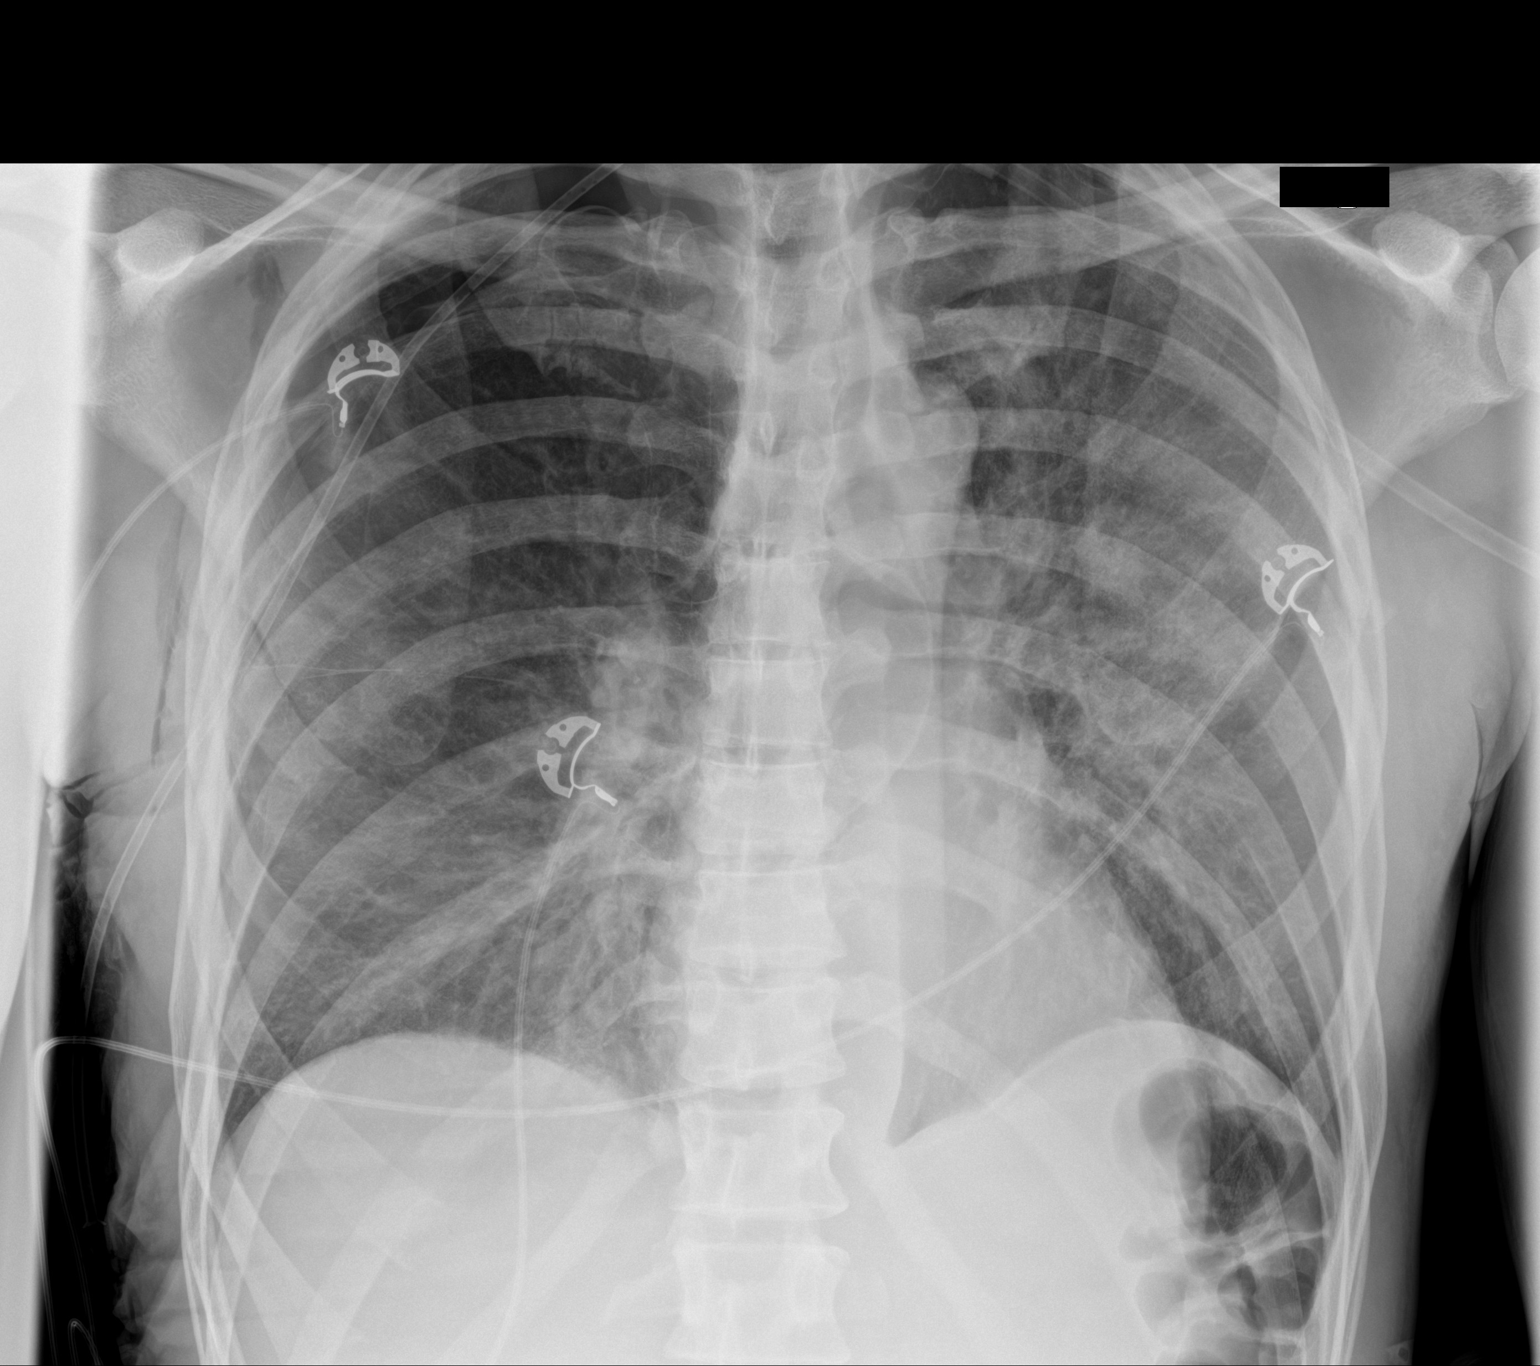

[1 of 1 positions shown; findings below may reference images not displayed]

FINDINGS: Right pigtail catheter remains with tip directed towards the apex.
Right pneumothorax is increased from exam yesterday, small to
moderate sized visualized at the apex just above the right fourth
rib. Probable right apical bleb. Curvilinear lucency projecting over
the right mediastinum may be an additional bleb, this is unchanged
from priors. Subcutaneous emphysema in the right chest wall. Slight
progression in interstitial opacities throughout the left lung and
in the right infrahilar region suggesting re-expansion pulmonary
edema.
IMPRESSION: 1. Increased size of right pneumothorax, small to moderate, pigtail
catheter in place.
2. Increasing interstitial opacities throughout the left lung and
right infrahilar region suggesting reexpansion pulmonary edema.
3. Right apical bleb. Possible additional bleb projecting over the
right mediastinum.

## 2020-10-10 IMAGING — CT CT CHEST W/ CM
2 of 3 series · 15 of 36 positions shown, 18 images · IV contrast (omnipaque)
Comparison: Chest radiographs, 04/22/2019

CLINICAL DATA: Follow-up pneumothorax

EXAM:
CT CHEST WITH CONTRAST
TECHNIQUE: Multidetector CT imaging of the chest was performed during
intravenous contrast administration.
CONTRAST:  75mL OMNIPAQUE IOHEXOL 300 MG/ML  SOLN

[Series 2: axial st · axial · 0.70mm/px · z∈[+744,+1038]mm · 12 of 173 slices shown, 15 images]
[im 13/173  mediastinal]
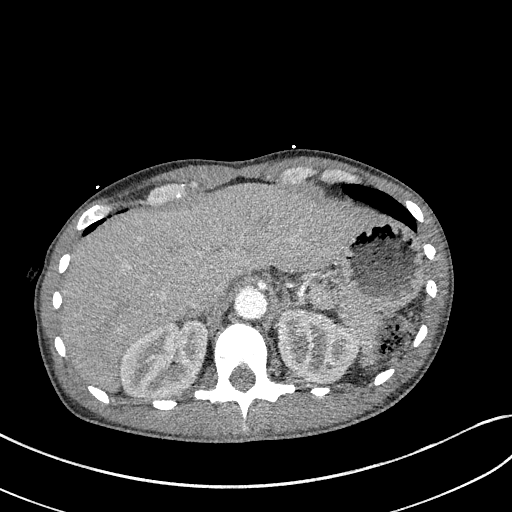
[im 13/173  lung]
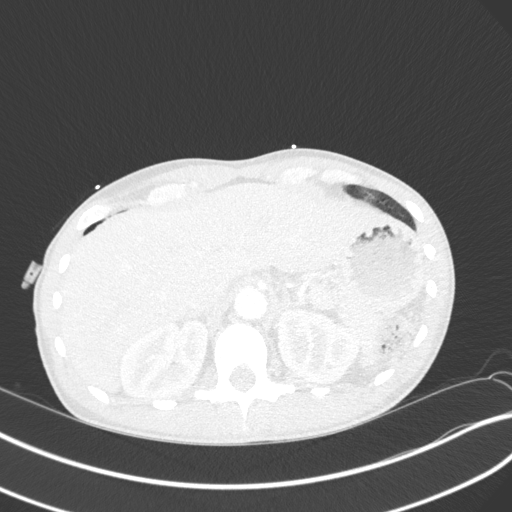
[im 26/173  lung]
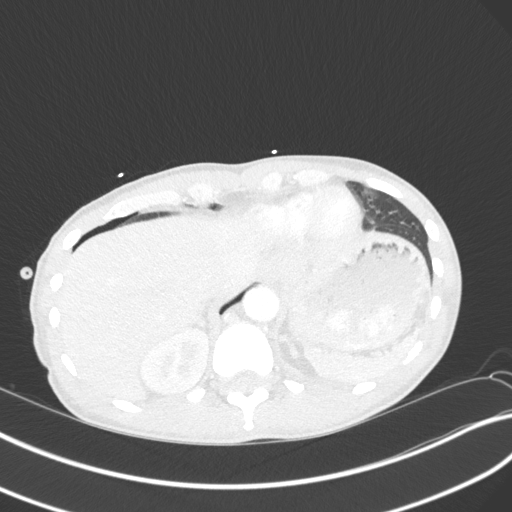
[im 39/173  lung]
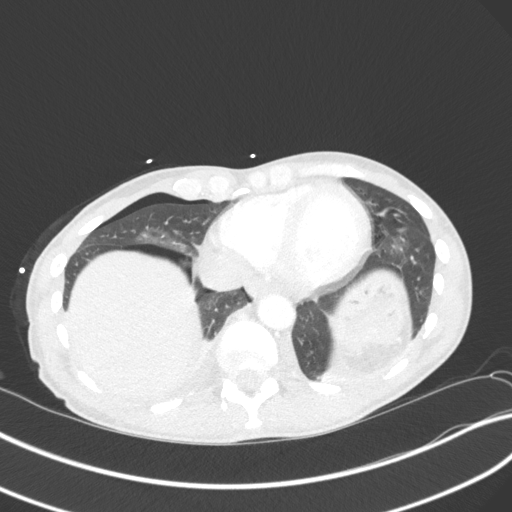
[im 51/173  lung]
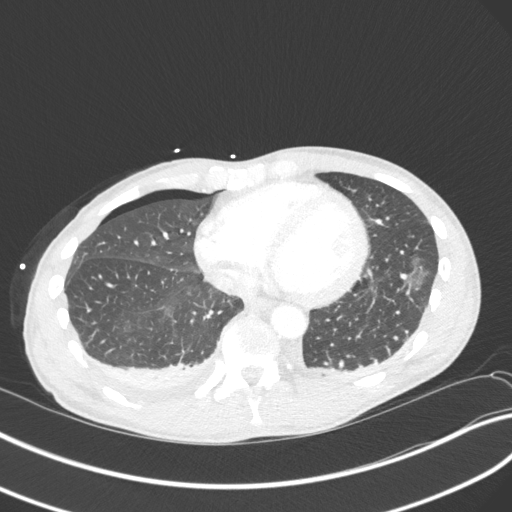
[im 64/173  mediastinal]
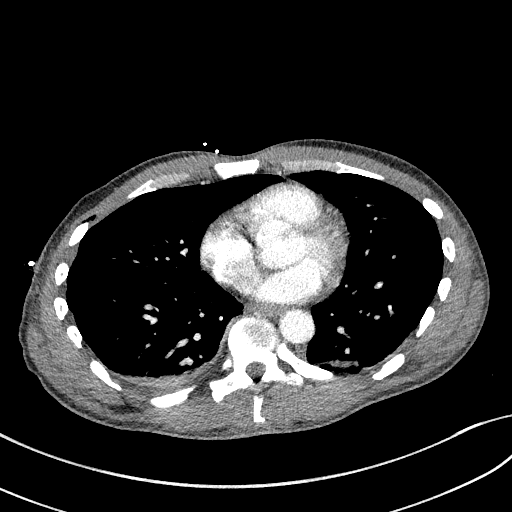
[im 64/173  lung]
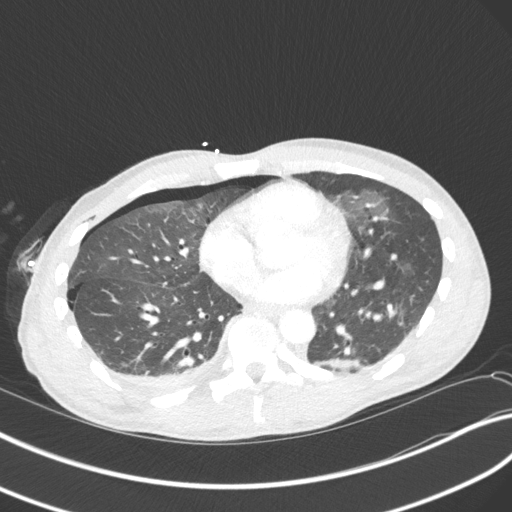
[im 77/173  lung]
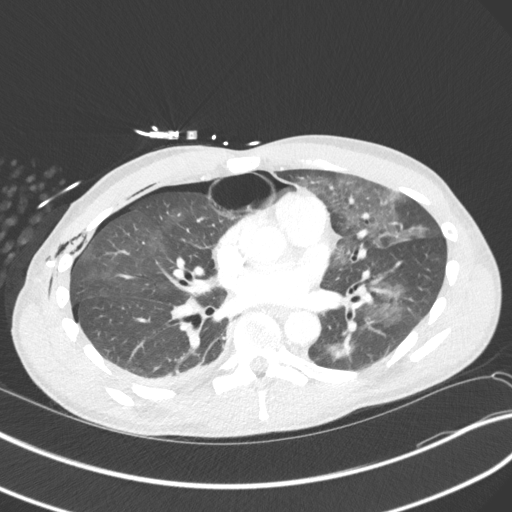
[im 96/173  lung]
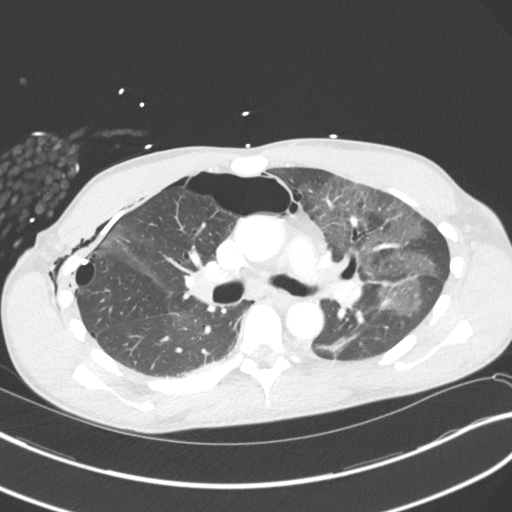
[im 109/173  lung]
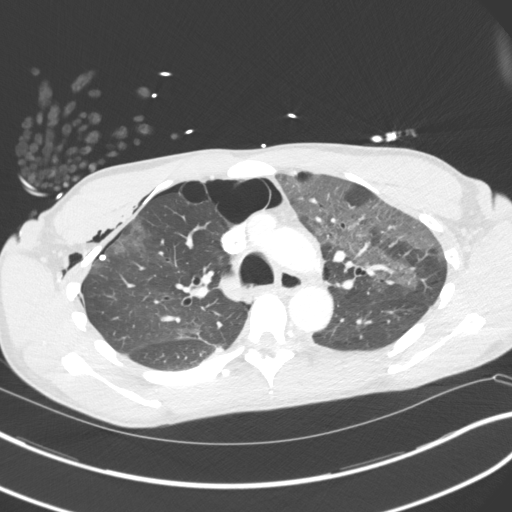
[im 122/173  mediastinal]
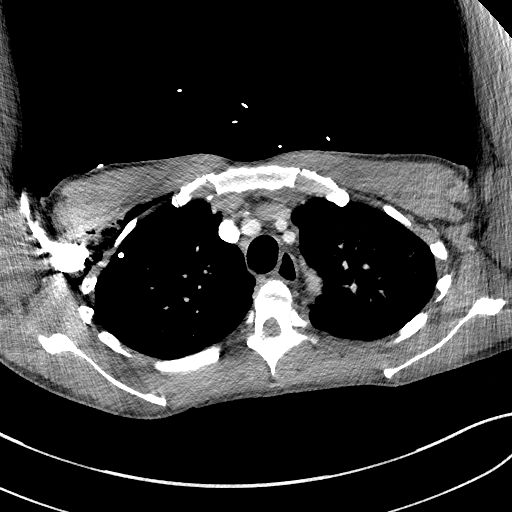
[im 122/173  lung]
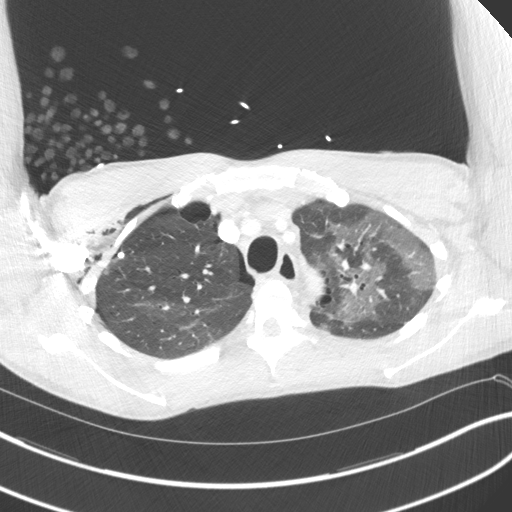
[im 134/173  lung]
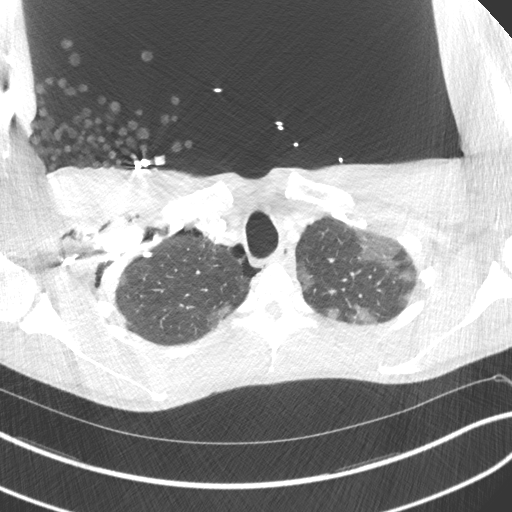
[im 147/173  lung]
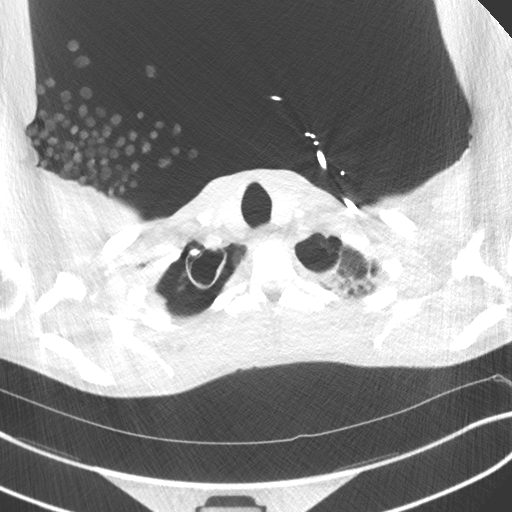
[im 160/173  lung]
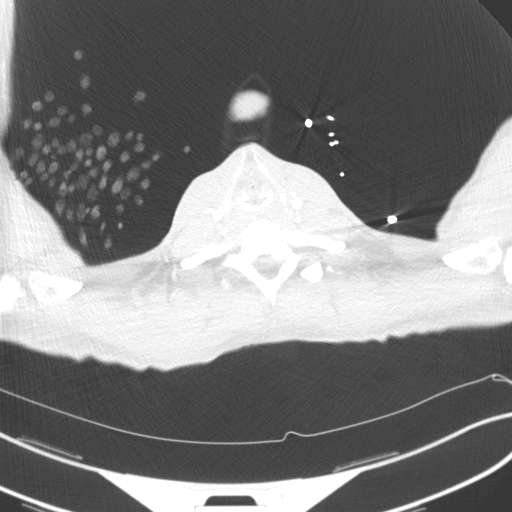

[Series 5: coronal · coronal · 0.73mm/px · 3 of 108 slices shown]
[im 22/108  lung]
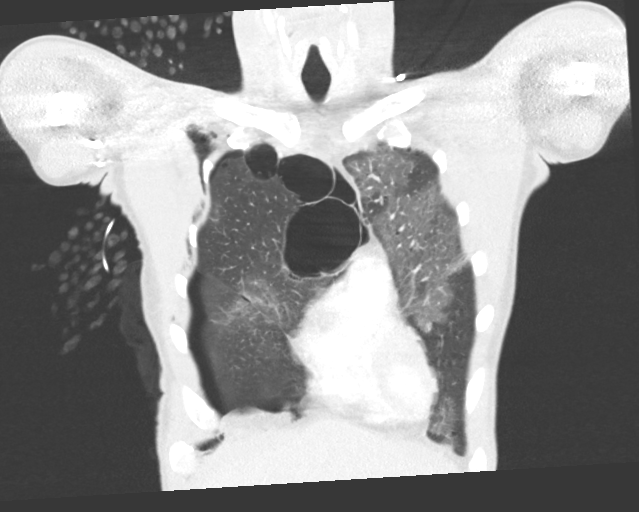
[im 43/108  lung]
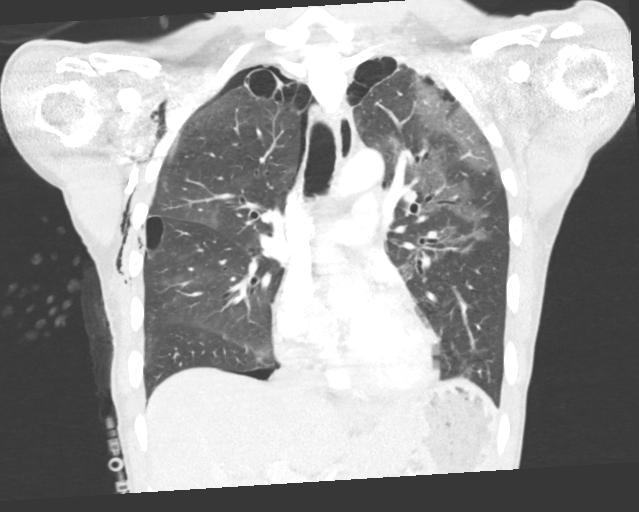
[im 65/108  lung]
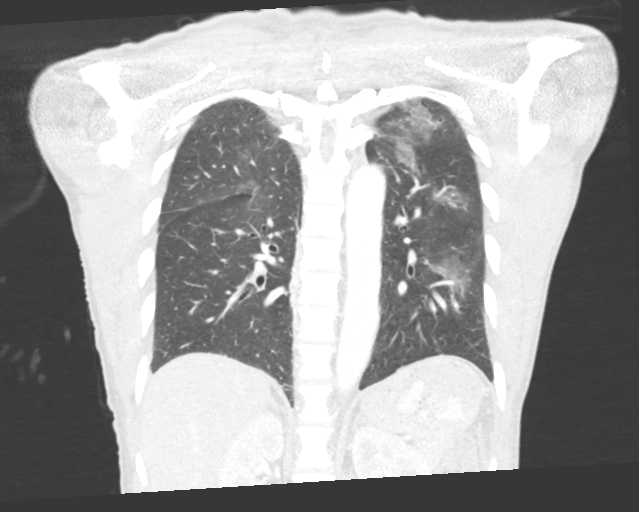

[15 of 36 positions shown; findings below may reference images not displayed]

FINDINGS: Cardiovascular: Aortic atherosclerosis. Normal heart size. No
pericardial effusion.

Mediastinum/Nodes: No enlarged mediastinal, hilar, or axillary lymph
nodes. Thyroid gland, trachea, and esophagus demonstrate no
significant findings.

Lungs/Pleura: Small, less than 10% right pneumothorax with a right
sided small bore chest tube positioned about the right pulmonary
apex. There are numerous paraseptal blebs bilaterally, larger and
more numerous in the right lung. No significant centrilobular
emphysema. There is extensive, left greater than right bilateral
pulmonary ground-glass opacity, most conspicuous in the left upper
lobe. Small bilateral pleural effusions and associated atelectasis
or consolidation.

Upper Abdomen: No acute abnormality.

Musculoskeletal: Subcutaneous emphysema about the right chest wall.
No chest wall mass or suspicious bone lesions identified.
IMPRESSION: 1. Small, less than 10% right pneumothorax with a right sided small
bore chest tube positioned about the right pulmonary apex.

2. There are numerous paraseptal blebs bilaterally, larger and more
numerous in the right lung. No significant centrilobular emphysema.

3. There is extensive, left greater than right bilateral pulmonary
ground-glass opacity, most conspicuous in the left upper lobe, in
keeping with multifocal infection.

4. Small bilateral pleural effusions and associated atelectasis or
consolidation.

5.  Aortic Atherosclerosis ([id number]).

## 2020-10-12 IMAGING — DX DG CHEST 1V PORT
1 series · 1 of 1 positions shown · non-contrast
Comparison: 04/23/2019

CLINICAL DATA: Postop

EXAM:
PORTABLE CHEST 1 VIEW

[chest ap]
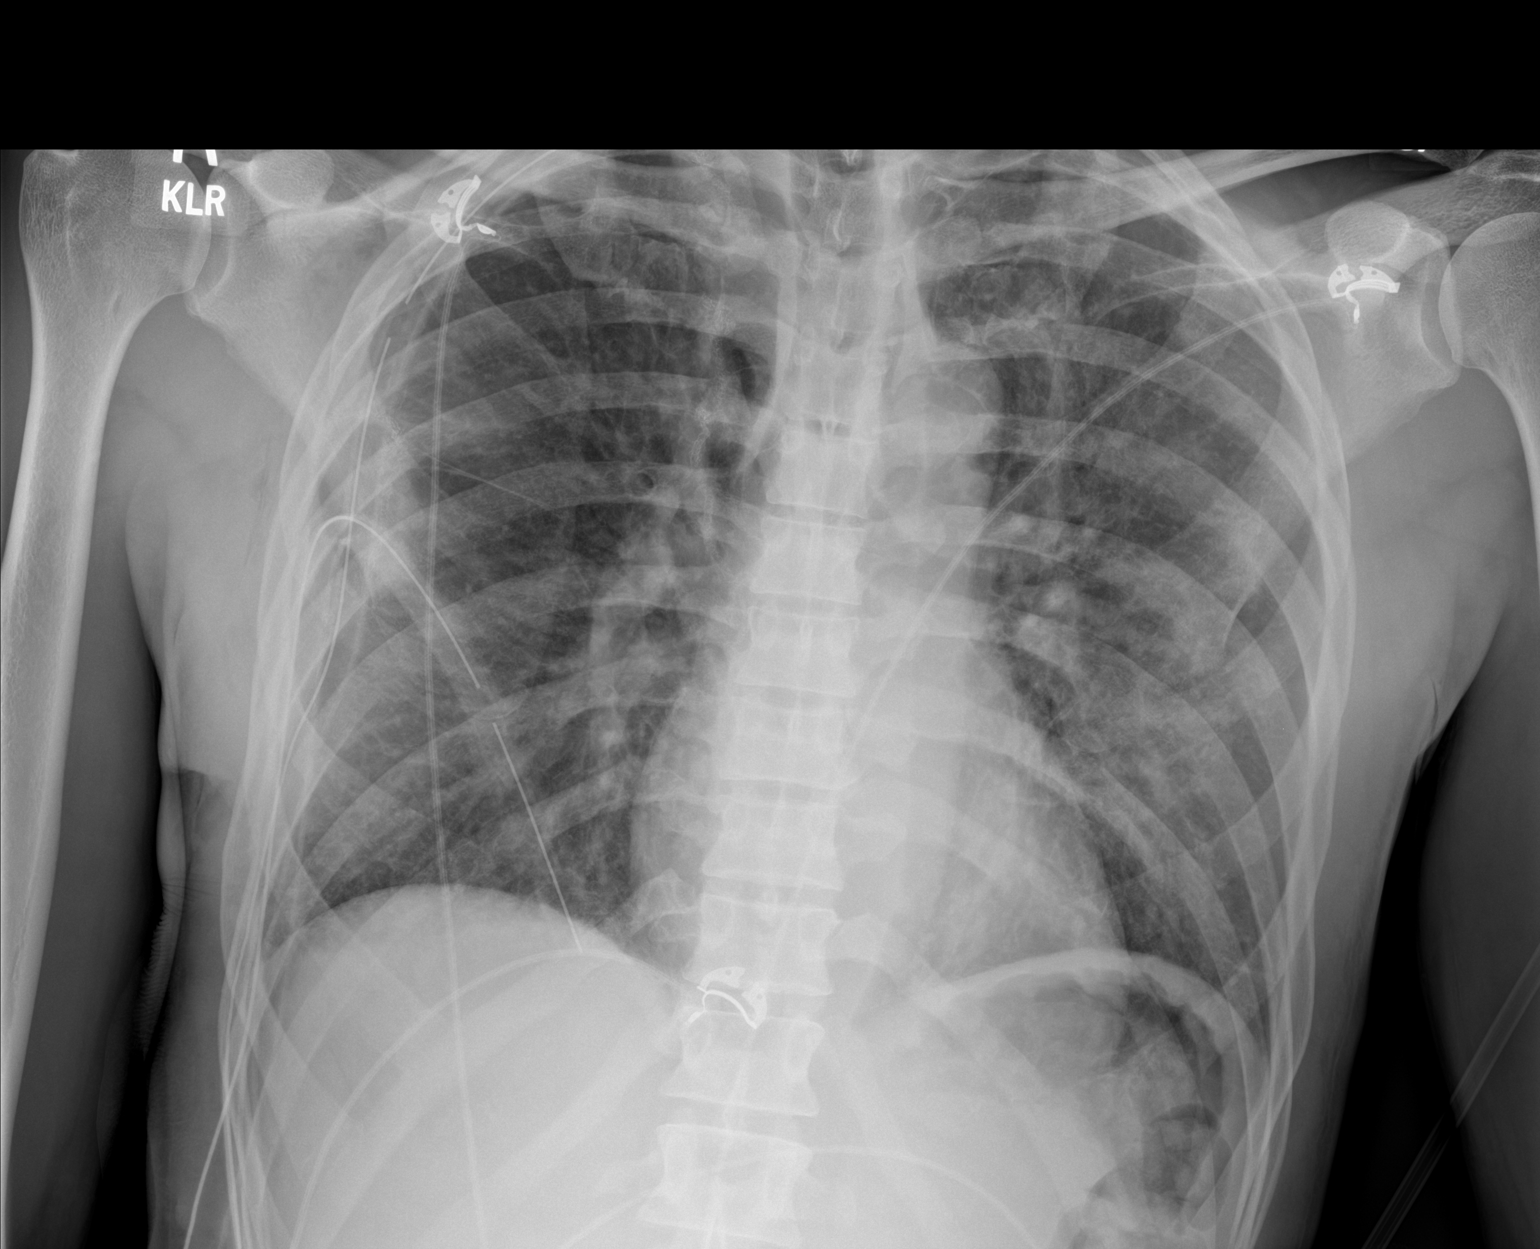

[1 of 1 positions shown; findings below may reference images not displayed]

FINDINGS: Two right chest tubes remain in place, unchanged. Postoperative
changes in the right lung. Suspect small pneumothorax in the medial
right upper hemithorax. Heart is normal size. Mild interstitial
prominence throughout the lungs, stable since prior study. No
visible effusions.
IMPRESSION: Postoperative changes on the right with right pneumothorax. Suspect
small medial right apical pneumothorax.

## 2024-02-24 ENCOUNTER — Encounter: Payer: Self-pay | Admitting: Emergency Medicine

## 2024-02-24 ENCOUNTER — Emergency Department: Admission: EM | Admit: 2024-02-24 | Discharge: 2024-02-24 | Disposition: A | Discharge status: Home / Self Care

## 2024-02-24 ENCOUNTER — Emergency Department

## 2024-02-24 ENCOUNTER — Other Ambulatory Visit: Payer: Self-pay

## 2024-02-24 DIAGNOSIS — Y908 Blood alcohol level of 240 mg/100 ml or more: Secondary | ICD-10-CM | POA: Insufficient documentation

## 2024-02-24 DIAGNOSIS — I10 Essential (primary) hypertension: Secondary | ICD-10-CM | POA: Insufficient documentation

## 2024-02-24 DIAGNOSIS — R55 Syncope and collapse: Secondary | ICD-10-CM | POA: Diagnosis not present

## 2024-02-24 DIAGNOSIS — F10120 Alcohol abuse with intoxication, uncomplicated: Secondary | ICD-10-CM | POA: Diagnosis present

## 2024-02-24 DIAGNOSIS — F10920 Alcohol use, unspecified with intoxication, uncomplicated: Secondary | ICD-10-CM

## 2024-02-24 LAB — COMPREHENSIVE METABOLIC PANEL WITH GFR
ALT: 24 U/L (ref 0–44)
AST: 42 U/L — ABNORMAL HIGH (ref 15–41)
Albumin: 3.7 g/dL (ref 3.5–5.0)
Alkaline Phosphatase: 66 U/L (ref 38–126)
Anion gap: 15 (ref 5–15)
BUN: 17 mg/dL (ref 6–20)
CO2: 21 mmol/L — ABNORMAL LOW (ref 22–32)
Calcium: 8.6 mg/dL — ABNORMAL LOW (ref 8.9–10.3)
Chloride: 108 mmol/L (ref 98–111)
Creatinine, Ser: 0.84 mg/dL (ref 0.61–1.24)
GFR, Estimated: >60 mL/min (ref >60)
Glucose, Bld: 71 mg/dL (ref 70–99)
Potassium: 3.8 mmol/L (ref 3.5–5.1)
Sodium: 144 mmol/L (ref 135–145)
Total Bilirubin: 0.6 mg/dL (ref 0.0–1.2)
Total Protein: 7.7 g/dL (ref 6.5–8.1)

## 2024-02-24 LAB — CBC
HCT: 36.2 % — ABNORMAL LOW (ref 39.0–52.0)
Hemoglobin: 12.8 g/dL — ABNORMAL LOW (ref 13.0–17.0)
MCH: 33.2 pg (ref 26.0–34.0)
MCHC: 35.4 g/dL (ref 30.0–36.0)
MCV: 94 fL (ref 80.0–100.0)
Platelets: 242 K/uL (ref 150–400)
RBC: 3.85 MIL/uL — ABNORMAL LOW (ref 4.22–5.81)
RDW: 16.4 % — ABNORMAL HIGH (ref 11.5–15.5)
WBC: 8.3 K/uL (ref 4.0–10.5)
nRBC: 0 % (ref 0.0–0.2)

## 2024-02-24 LAB — ETHANOL: Alcohol, Ethyl (B): 319 mg/dL (ref <15)

## 2024-02-24 LAB — TROPONIN I (HIGH SENSITIVITY)
Troponin I (High Sensitivity): 7 ng/L (ref <18)
Troponin I (High Sensitivity): 8 ng/L (ref <18)

## 2024-02-24 MED ORDER — SODIUM CHLORIDE 0.9 % IV BOLUS
1000.0000 mL | Freq: Once | INTRAVENOUS | Status: AC
  Administered 2024-02-24: 1000 mL via INTRAVENOUS

## 2024-02-24 NOTE — ED Triage Notes (Signed)
 Pt from home via [facility] with reports of left sided pain after having lung surgery in 2019. Pt reports that his left leg feels like it is tingling. Pt also reporting syncope.

## 2024-02-24 NOTE — ED Triage Notes (Signed)
 Arrives from home via [facility]. C/O left leg and left lung pain.  VS wnl.

## 2024-02-24 NOTE — Discharge Instructions (Signed)
 Your evaluation in the emergency department was notable for alcohol intoxication. We saw no other abnormalities today.  Please do follow-up closely with your primary care provider and cardiology provider for reevaluation, and return to the emergency department with any new or worsening symptoms.
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
